# Patient Record
Sex: Male | Born: 1970 | Race: Black or African American | Hispanic: No | Marital: Single | Smoking: Never smoker
Health system: Southern US, Community
[De-identification: ages and names within clinical notes are randomized; demographics above are authoritative.]

## PROBLEM LIST (undated history)

## (undated) DIAGNOSIS — M1990 Unspecified osteoarthritis, unspecified site: Secondary | ICD-10-CM

## (undated) DIAGNOSIS — F419 Anxiety disorder, unspecified: Secondary | ICD-10-CM

## (undated) DIAGNOSIS — I10 Essential (primary) hypertension: Secondary | ICD-10-CM

## (undated) DIAGNOSIS — Z9889 Other specified postprocedural states: Secondary | ICD-10-CM

## (undated) DIAGNOSIS — I341 Nonrheumatic mitral (valve) prolapse: Secondary | ICD-10-CM

## (undated) DIAGNOSIS — I4891 Unspecified atrial fibrillation: Secondary | ICD-10-CM

## (undated) DIAGNOSIS — G4730 Sleep apnea, unspecified: Secondary | ICD-10-CM

## (undated) HISTORY — PX: NO PAST SURGERIES: SHX2092

## (undated) HISTORY — DX: Sleep apnea, unspecified: G47.30

## (undated) HISTORY — DX: Unspecified atrial fibrillation: I48.91

## (undated) HISTORY — DX: Nonrheumatic mitral (valve) prolapse: I34.1

## (undated) HISTORY — PX: COLONOSCOPY: SHX174

## (undated) HISTORY — PX: UMBILICAL HERNIA REPAIR: SHX196

## (undated) HISTORY — DX: Essential (primary) hypertension: I10

---

## 1997-12-07 ENCOUNTER — Emergency Department (HOSPITAL_COMMUNITY): Admission: EM | Admit: 1997-12-07 | Discharge: 1997-12-07 | Payer: Self-pay | Admitting: Emergency Medicine

## 2001-09-30 ENCOUNTER — Inpatient Hospital Stay (HOSPITAL_COMMUNITY): Admission: EM | Admit: 2001-09-30 | Discharge: 2001-10-02 | Payer: Self-pay | Admitting: Emergency Medicine

## 2001-09-30 ENCOUNTER — Encounter: Payer: Self-pay | Admitting: Emergency Medicine

## 2001-10-11 ENCOUNTER — Encounter: Admission: RE | Admit: 2001-10-11 | Discharge: 2001-10-11 | Payer: Self-pay | Admitting: Internal Medicine

## 2002-04-24 ENCOUNTER — Emergency Department (HOSPITAL_COMMUNITY): Admission: EM | Admit: 2002-04-24 | Discharge: 2002-04-24 | Payer: Self-pay | Admitting: Emergency Medicine

## 2012-05-10 ENCOUNTER — Emergency Department (HOSPITAL_COMMUNITY): Payer: BC Managed Care – PPO

## 2012-05-10 ENCOUNTER — Encounter (HOSPITAL_COMMUNITY): Payer: Self-pay | Admitting: Emergency Medicine

## 2012-05-10 ENCOUNTER — Emergency Department (HOSPITAL_COMMUNITY)
Admission: EM | Admit: 2012-05-10 | Discharge: 2012-05-11 | Disposition: A | Discharge status: Home / Self Care | Payer: BC Managed Care – PPO | Attending: Emergency Medicine | Admitting: Emergency Medicine

## 2012-05-10 DIAGNOSIS — R42 Dizziness and giddiness: Secondary | ICD-10-CM | POA: Insufficient documentation

## 2012-05-10 DIAGNOSIS — I4891 Unspecified atrial fibrillation: Secondary | ICD-10-CM | POA: Insufficient documentation

## 2012-05-10 DIAGNOSIS — R0602 Shortness of breath: Secondary | ICD-10-CM | POA: Insufficient documentation

## 2012-05-10 DIAGNOSIS — Z8659 Personal history of other mental and behavioral disorders: Secondary | ICD-10-CM | POA: Insufficient documentation

## 2012-05-10 HISTORY — DX: Anxiety disorder, unspecified: F41.9

## 2012-05-10 LAB — CBC WITH DIFFERENTIAL/PLATELET
Basophils Absolute: 0 10*3/uL (ref 0.0–0.1)
Basophils Relative: 1 % (ref 0–1)
Eosinophils Absolute: 0.1 10*3/uL (ref 0.0–0.7)
Eosinophils Relative: 2 % (ref 0–5)
HCT: 44 % (ref 39.0–52.0)
Hemoglobin: 15.7 g/dL (ref 13.0–17.0)
Lymphocytes Relative: 44 % (ref 12–46)
Lymphs Abs: 2.1 10*3/uL (ref 0.7–4.0)
MCH: 30.1 pg (ref 26.0–34.0)
MCHC: 35.7 g/dL (ref 30.0–36.0)
MCV: 84.5 fL (ref 78.0–100.0)
Monocytes Absolute: 0.5 10*3/uL (ref 0.1–1.0)
Monocytes Relative: 10 % (ref 3–12)
Neutro Abs: 2.1 10*3/uL (ref 1.7–7.7)
Neutrophils Relative %: 43 % (ref 43–77)
Platelets: 215 10*3/uL (ref 150–400)
RBC: 5.21 MIL/uL (ref 4.22–5.81)
RDW: 12.8 % (ref 11.5–15.5)
WBC: 4.8 10*3/uL (ref 4.0–10.5)

## 2012-05-10 LAB — BASIC METABOLIC PANEL
BUN: 18 mg/dL (ref 6–23)
CO2: 26 mEq/L (ref 19–32)
Calcium: 9.8 mg/dL (ref 8.4–10.5)
Chloride: 103 mEq/L (ref 96–112)
Creatinine, Ser: 0.98 mg/dL (ref 0.50–1.35)
GFR calc Af Amer: >90 mL/min (ref >90)
GFR calc non Af Amer: >90 mL/min (ref >90)
Glucose, Bld: 80 mg/dL (ref 70–99)
Potassium: 3.9 mEq/L (ref 3.5–5.1)
Sodium: 140 mEq/L (ref 135–145)

## 2012-05-10 LAB — TROPONIN I: Troponin I: <0.3 ng/mL (ref <0.30)

## 2012-05-10 MED ORDER — DILTIAZEM HCL 100 MG IV SOLR
10.0000 mg/h | Freq: Once | INTRAVENOUS | Status: AC
  Administered 2012-05-10: 10 mg/h via INTRAVENOUS
  Filled 2012-05-10: qty 100

## 2012-05-10 MED ORDER — DILTIAZEM HCL 50 MG/10ML IV SOLN
20.0000 mg | Freq: Once | INTRAVENOUS | Status: AC
  Administered 2012-05-10: 20 mg via INTRAVENOUS

## 2012-05-10 NOTE — ED Provider Notes (Addendum)
History     CSN: [id number]  Arrival date & time 05/10/12  2221   First MD Initiated Contact with Patient 05/10/12 2242      Chief Complaint  Patient presents with  . Palpitations    (Consider location/radiation/quality/duration/timing/severity/associated sxs/prior treatment) Patient is a 41 y.o. male presenting with palpitations. The history is provided by the patient.  Palpitations  Associated symptoms include shortness of breath. Pertinent negatives include no fever, no chest pain, no nausea, no vomiting, no headaches and no cough.   41 year old, male, with no significant past medical history presents to emergency department complaining of lightheadedness, palpitations in his heart, and mild shortness of breath.  He has been having similar mild symptoms for approximately a week.  When he lied down to go to bed.  Today.  His symptoms became more severe, so, he came to the emergency department.  He denies chest pain.  He denies fevers, chills, cough.  He denies leg pain or swelling.  He has not had any recent travel.  He does not smoke.  He denies drug use.  He does not use caffeinated beverages or any stimulants.  He denies bleeding from his nose, bottom, or any other sites.  Level V caveat applies for urgent need for intervention  Past Medical History  Diagnosis Date  . Anxiety     History reviewed. No pertinent past surgical history.  No family history on file.  History  Substance Use Topics  . Smoking status: Not on file  . Smokeless tobacco: Not on file  . Alcohol Use:       Review of Systems  Constitutional: Negative for fever and chills.  Respiratory: Positive for shortness of breath. Negative for cough and chest tightness.   Cardiovascular: Positive for palpitations. Negative for chest pain and leg swelling.  Gastrointestinal: Negative for nausea and vomiting.  Neurological: Positive for light-headedness. Negative for headaches.  All other systems reviewed and  are negative.    Allergies  Review of patient's allergies indicates no known allergies.  Home Medications  No current outpatient prescriptions on file.  BP 161/116  Pulse 130  Temp 98.3 F (36.8 C) (Oral)  Resp 20  SpO2 98%  Physical Exam  Nursing note and vitals reviewed. Constitutional: He is oriented to person, place, and time. He appears well-developed and well-nourished. No distress.  HENT:  Head: Normocephalic and atraumatic.  Eyes: Conjunctivae normal and EOM are normal.  Neck: Normal range of motion. Neck supple.  Cardiovascular: Intact distal pulses.   No murmur heard.      Tachycardia and irregular  Pulmonary/Chest: Effort normal and breath sounds normal. No respiratory distress. He has no rales.  Abdominal: Soft. Bowel sounds are normal. There is no tenderness.  Musculoskeletal: Normal range of motion. He exhibits no edema and no tenderness.  Neurological: He is alert and oriented to person, place, and time. No cranial nerve deficit.  Skin: Skin is warm and dry.  Psychiatric: He has a normal mood and affect. Thought content normal.    ED Course  Procedures (including critical care time) 41 year old, male, with no significant medical history presents with new-onset atrial fibrillation, with rapid ventricular response.  No identifiable inciting etiology.  We will perform laboratory testing, and chest x-ray, and treat with diltiazem and then consult cardiology, for further recommendations   Labs Reviewed  BASIC METABOLIC PANEL  CBC WITH DIFFERENTIAL  TROPONIN I   No results found.   No diagnosis found.  11:22 PM Rate controlled after  dilt bolus 20 mg.   Dilt. 10 mg//hr gtt started  11:56 PM Asx. Back in nsr.    ECG #1 Atrial fibrillation with rapid ventricular response at 128 beats per minute Normal axis. Normal intervals. 1 mm of ST segment elevation in leads V3 and V4.  ECG #2 Atrial fibrillation at 76 beats per minute.   Normal axis ST  segment elevation 1 mm in leads 1 and aVL with 2-3 mm elevation in lead V2 3 and V4 Normal intervals  ECG.  #3 Normal sinus rhythm at 84 beats per minute. Normal axis. Normal intervals. 1 mm ST segment elevation in lead 1 and aVL 2-3 mm ST segment elevation in leads V2, V3, V4  12:06 AM I spoke with the cardiologist, and asked him to come, evaluate the patient for new onset atrial fibrillation, with ST segment elevation on the EKGs.  We discussed both the history and physical of the patient including his lack of risk factors for coronary disease.  We discussed the EKGs and the negative cardiac enzymes.  The cardiologist asked whether or not.  This needs to be considered.  A STEMI and I told him I do not think so because the patient is asymptomatic.  I requested he come to the emergency department to evaluate the patient.  He stated he is in the Cath Lab, but he will be here within the next 10 minutes.  12:13 AM The cardiologist, was able to pull up.  The EKGs on the new system.  He said that the patient has LVH, with early repo and since the patient is asymptomatic.  He does not think any further workup is indicated.  He agreed that the patient could be discharged and followup in the office.  MDM  Atrial fibrillation, with rapid ventricular response, new onset        Cheri Guppy, MD 05/10/12 1610  Cheri Guppy, MD 05/10/12 9604  Cheri Guppy, MD 05/10/12 2357  Cheri Guppy, MD 05/11/12 5409  Cheri Guppy, MD 05/11/12 8119

## 2012-05-10 NOTE — ED Notes (Signed)
Advised by York Cerise, EMT that the patient has ST elevation on the monitor.  EKG taken and showed to EDP.

## 2012-05-10 NOTE — ED Notes (Signed)
PER EMS- Pt states for the last week he has been having trouble catching his breath. States tonight he feels like he having palpitation and discomfort/pressure in his chest. Sensation in chest is non radiating. Pt states in his 20's he had anxiety issues. Sinus Tach on monitor. NDKA. Alertx4, NAD. HR 80's, BP 163/110, 99% 2L [place].

## 2012-05-11 NOTE — ED Notes (Signed)
The patient is AOx4 and comfortable with his discharge instructions. 

## 2012-05-15 ENCOUNTER — Encounter: Payer: Self-pay | Admitting: Cardiovascular Disease

## 2012-05-15 ENCOUNTER — Ambulatory Visit (INDEPENDENT_AMBULATORY_CARE_PROVIDER_SITE_OTHER): Payer: BC Managed Care – PPO | Admitting: Cardiovascular Disease

## 2012-05-15 VITALS — BP 154/110 | HR 87 | Ht 68.0 in | Wt 185.1 lb

## 2012-05-15 DIAGNOSIS — I10 Essential (primary) hypertension: Secondary | ICD-10-CM | POA: Insufficient documentation

## 2012-05-15 DIAGNOSIS — I4891 Unspecified atrial fibrillation: Secondary | ICD-10-CM

## 2012-05-15 MED ORDER — HYDROCHLOROTHIAZIDE 25 MG PO TABS: 25.0000 mg | ORAL_TABLET | Freq: Every day | ORAL | Status: DC

## 2012-05-15 MED ORDER — ASPIRIN EC 81 MG PO TBEC: 81.0000 mg | DELAYED_RELEASE_TABLET | Freq: Every day | ORAL | Status: DC

## 2012-05-15 MED ORDER — CARVEDILOL 12.5 MG PO TABS: 12.5000 mg | ORAL_TABLET | Freq: Two times a day (BID) | ORAL | Status: DC

## 2012-05-15 MED ORDER — POTASSIUM CHLORIDE CRYS ER 10 MEQ PO TBCR: 10.0000 meq | EXTENDED_RELEASE_TABLET | Freq: Every day | ORAL | Status: DC

## 2012-05-15 NOTE — Patient Instructions (Addendum)
Your physician recommends that you schedule a follow-up appointment in: 1 month TO SEE LORI GERHARDT NP THEN 3 MONTHS TO SEE DR [facility]  Your physician has recommended you make the following change in your medication:   START CARVEDILOL 12.5 MG ONE TABLET TWICE DAILY 12 HOURS APART HCTZ 25 MG IN THE MORNING KDUR OR POTASSIUM 10 MEQ ONE TABLET DAILY WITH HCTZ ASPIRIN ENTERIC COATED 81 MG DAILY  Your physician recommends that you return for lab work in: TODAY TSH TO CHECK YOUR THYROID  Your physician has requested that you have an echocardiogram. Echocardiography is a painless test that uses sound waves to create images of your heart. It provides your doctor with information about the size and shape of your heart and how well your heart's chambers and valves are working. This procedure takes approximately one hour. There are no restrictions for this procedure.

## 2012-05-15 NOTE — Progress Notes (Signed)
    Jon Collins Date of Birth  01-Jun-1971       [facility]    [place] [address]  [address] [place], [place]  [address]   [place], [place]  [address] [phone]     [phone]   Fax  [phone]    Fax [phone]  Problem List: 1. Atrial Fibrillation 2. Hypertension  History of Present Illness:  Jon Collins is a 41 yo with a recentl ER visit for palpitations and dyspnea.  He was found to have A-Fib and also had ST elevation ( repol abnormality).  He had some lightheadedness with standing and called EMS.  He has been active in the past.  He was a runner in the past and is starting exercising.     He has numerous questions -     No current outpatient prescriptions on file prior to visit.    No Known Allergies  Past Medical History  Diagnosis Date  . Anxiety   . MVP (mitral valve prolapse)     ?patient states in his 74s he was diagnos with this    No past surgical history on file.  History  Smoking status  . Never Smoker   Smokeless tobacco  . Not on file    History  Alcohol Use     History reviewed. No pertinent family history.  Reviw of Systems:  Reviewed in the HPI.  All other systems are negative.  Physical Exam: Blood pressure 154/110, pulse 87, height 5\' 8"  (1.727 m), weight 185 lb 1.9 oz (83.97 kg). General: Well developed, well nourished, in no acute distress.  Head: Normocephalic, atraumatic, sclera non-icteric, mucus membranes are moist,   Neck: Supple. Carotids are 2 + without bruits. No JVD  Lungs: Clear bilaterally to auscultation.  Heart: regular rate.  normal  S1 S2. No murmurs, gallops or rubs.  Abdomen: Soft, non-tender, non-distended with normal bowel sounds. No hepatomegaly. No rebound/guarding. No masses.  Msk:  Strength and tone are normal  Extremities: No clubbing or cyanosis. No edema.  Distal pedal pulses are 2+ and equal bilaterally.  Neuro: Alert and oriented X 3.  Moves all extremities spontaneously.  Psych:  Responds to questions appropriately with a normal affect.  ECG: Nov. 5, 2013 - NSR, minimal voltage for LVH.  ST elevation anteriorly - unchanged from 05/10/12  Assessment / Plan:

## 2012-05-15 NOTE — Assessment & Plan Note (Signed)
Jon Collins presented to the ER with palpitations and was found to be in AF.  He was given IV diltiazem and converted to NSR.  We will continue to follow.  Will get an echo.  We discussed the possible etiologies.  It may be due to his HTN.  Will check TSH.  No significant alcohol intake., no drugs.

## 2012-05-15 NOTE — Assessment & Plan Note (Signed)
He has repol changes on his ECg.  I suspect that he has had HTN for a while.  Will add coreg 12.5 BID ( he is to start at 6.25 BID for the first week).  Will add HCTZ 25,Kdur 10 ).  Continue regular workouts.  He is not to lift heavy weights.

## 2012-05-16 ENCOUNTER — Ambulatory Visit (HOSPITAL_COMMUNITY): Payer: BC Managed Care – PPO | Attending: Cardiovascular Disease

## 2012-05-16 DIAGNOSIS — R0989 Other specified symptoms and signs involving the circulatory and respiratory systems: Secondary | ICD-10-CM | POA: Insufficient documentation

## 2012-05-16 DIAGNOSIS — I059 Rheumatic mitral valve disease, unspecified: Secondary | ICD-10-CM | POA: Insufficient documentation

## 2012-05-16 DIAGNOSIS — I10 Essential (primary) hypertension: Secondary | ICD-10-CM | POA: Insufficient documentation

## 2012-05-16 DIAGNOSIS — R0609 Other forms of dyspnea: Secondary | ICD-10-CM | POA: Insufficient documentation

## 2012-05-16 DIAGNOSIS — I4891 Unspecified atrial fibrillation: Secondary | ICD-10-CM | POA: Insufficient documentation

## 2012-05-16 DIAGNOSIS — R002 Palpitations: Secondary | ICD-10-CM | POA: Insufficient documentation

## 2012-05-16 NOTE — Progress Notes (Signed)
Echocardiogram performed.  

## 2012-06-13 ENCOUNTER — Encounter: Payer: Self-pay | Admitting: Nurse Practitioner

## 2012-06-13 ENCOUNTER — Ambulatory Visit (INDEPENDENT_AMBULATORY_CARE_PROVIDER_SITE_OTHER): Payer: BC Managed Care – PPO | Admitting: Nurse Practitioner

## 2012-06-13 VITALS — BP 110/88 | HR 76 | Ht 68.0 in | Wt 184.8 lb

## 2012-06-13 DIAGNOSIS — I10 Essential (primary) hypertension: Secondary | ICD-10-CM

## 2012-06-13 NOTE — Patient Instructions (Addendum)
You may use Coricidin HBP for any cold symptoms  Stay away from decongestants. No Afrin.   Stay on your current medicines  Dr. Elease Hashimoto will see you in about 2 months.  Call the [facility] office at [phone] if you have any questions, problems or concerns.

## 2012-06-13 NOTE — Progress Notes (Signed)
Jon Collins Date of Birth: 07/23/1970 Medical Record #[id number]  History of Present Illness: Jon Collins is seen back today for a one month check. He is seen for Dr. Elease Collins. He has had a lone episode of atrial fib. Has early repol on EKG. Started on BP medicines. Other issues include anxiety and MVP.   He comes in today. He is here alone. He has been started on Coreg, HCTZ and low dose KCL. An echo has shown grade 1 diastolic dysfunction but with a normal EF. He is doing good. Feeling better. No more palpitations. BP has trended down nicely. He checks it at work at [employer]. Still with some rare shortness of breath but overall improved. He does feel like he has some sinus issues and is asking about medicine for cold, cough, etc. He is back exercising.   Current Outpatient Prescriptions on File Prior to Visit  Medication Sig Dispense Refill  . aspirin EC 81 MG tablet Take 1 tablet (81 mg total) by mouth daily.  90 tablet  3  . carvedilol (COREG) 12.5 MG tablet Take 1 tablet (12.5 mg total) by mouth 2 (two) times daily with a meal.  60 tablet  5  . hydrochlorothiazide (HYDRODIURIL) 25 MG tablet Take 1 tablet (25 mg total) by mouth daily.  30 tablet  5  . potassium chloride (K-DUR,KLOR-CON) 10 MEQ tablet Take 1 tablet (10 mEq total) by mouth daily.  30 tablet  5    No Known Allergies  Past Medical History  Diagnosis Date  . Anxiety   . MVP (mitral valve prolapse)     ?patient states in his 43s he was diagnos with this  . PAF (paroxysmal atrial fibrillation)   . HTN (hypertension)     History reviewed. No pertinent past surgical history.  History  Smoking status  . Never Smoker   Smokeless tobacco  . Not on file    History  Alcohol Use No    History reviewed. No pertinent family history.  Review of Systems: The review of systems is per the HPI.  All other systems were reviewed and are negative.  Physical Exam: BP 110/88  Pulse 76  Ht 5\' 8"  (1.727 m)  Wt 184 lb  12.8 oz (83.825 kg)  BMI 28.10 kg/m2  SpO2 98% Patient is very pleasant and in no acute distress. Skin is warm and dry. Color is normal.  HEENT is unremarkable. Normocephalic/atraumatic. PERRL. Sclera are nonicteric. Neck is supple. No masses. No JVD. Lungs are clear. Cardiac exam shows a regular rate and rhythm. Abdomen is soft. Extremities are without edema. Gait and ROM are intact. No gross neurologic deficits noted.  LABORATORY DATA: BMET is pending  Echo Study Conclusions  - Left ventricle: The cavity size was normal. Wall thickness was normal. Systolic function was normal. The estimated ejection fraction was in the range of 55% to 65%. Wall motion was normal; there were no regional wall motion abnormalities. Features are consistent with a pseudonormal left ventricular filling pattern, with concomitant abnormal relaxation and increased filling pressure (grade 2 diastolic dysfunction). - Atrial septum: No defect or patent foramen ovale was identified.  Lab Results  Component Value Date   WBC 4.8 05/10/2012   HGB 15.7 05/10/2012   HCT 44.0 05/10/2012   PLT 215 05/10/2012   GLUCOSE 80 05/10/2012   NA 140 05/10/2012   K 3.9 05/10/2012   CL 103 05/10/2012   CREATININE 0.98 05/10/2012   BUN 18 05/10/2012   CO2 26  05/10/2012   TSH 0.71 05/15/2012     Assessment / Plan: 1. HTN - blood pressure is much better. Recheck a BMET today. He is to see Dr. Elease Collins in February.  2. PAF - remains in sinus.   We have talked about cold medicines. Needs to avoid decongestants. See Dr. Elease Collins in February.   Patient is agreeable to this plan and will call if any problems develop in the interim.

## 2012-06-14 LAB — BASIC METABOLIC PANEL
BUN: 15 mg/dL (ref 6–23)
CO2: 32 mEq/L (ref 19–32)
Calcium: 9.2 mg/dL (ref 8.4–10.5)
Chloride: 100 mEq/L (ref 96–112)
Creatinine, Ser: 1 mg/dL (ref 0.4–1.5)
GFR: 100.26 mL/min (ref >60.00)
Glucose, Bld: 87 mg/dL (ref 70–99)
Potassium: 3.7 mEq/L (ref 3.5–5.1)
Sodium: 137 mEq/L (ref 135–145)

## 2012-07-23 ENCOUNTER — Telehealth: Payer: Self-pay | Admitting: Cardiovascular Disease

## 2012-07-23 NOTE — Telephone Encounter (Signed)
PT WOULD LIKE TO DISCUSS HIS MEDS BECAUSE SHE DOSE NOT FEEL ANY BETTER

## 2012-07-23 NOTE — Telephone Encounter (Signed)
Pt c/o continued palpitations with sob off and on, request app sooner than app in February with Dr Elease Hashimoto, app given.

## 2012-07-27 ENCOUNTER — Encounter: Payer: Self-pay | Admitting: Nurse Practitioner

## 2012-07-27 ENCOUNTER — Ambulatory Visit (INDEPENDENT_AMBULATORY_CARE_PROVIDER_SITE_OTHER): Payer: BC Managed Care – PPO

## 2012-07-27 ENCOUNTER — Ambulatory Visit (INDEPENDENT_AMBULATORY_CARE_PROVIDER_SITE_OTHER): Payer: BC Managed Care – PPO | Admitting: Nurse Practitioner

## 2012-07-27 VITALS — BP 147/84 | HR 78 | Ht 68.0 in | Wt 183.8 lb

## 2012-07-27 DIAGNOSIS — R002 Palpitations: Secondary | ICD-10-CM

## 2012-07-27 LAB — BASIC METABOLIC PANEL
BUN: 17 mg/dL (ref 6–23)
CO2: 31 mEq/L (ref 19–32)
Calcium: 9.4 mg/dL (ref 8.4–10.5)
Chloride: 103 mEq/L (ref 96–112)
Creatinine, Ser: 1.1 mg/dL (ref 0.4–1.5)
GFR: 99.11 mL/min (ref >60.00)
Glucose, Bld: 90 mg/dL (ref 70–99)
Potassium: 3.7 mEq/L (ref 3.5–5.1)
Sodium: 139 mEq/L (ref 135–145)

## 2012-07-27 NOTE — Progress Notes (Signed)
Jon Collins Date of Birth: 1971/02/26 Medical Record #[id number]  History of Present Illness: Jon Collins is seen back today for a work in visit. He is seen for Dr. Elease Hashimoto. He has had a lone episode of atrial fib. Has early repol on EKG. Now on BP medicines. Other issues include MVP and anxiety.   I saw him about 6 weeks ago. He was doing ok.   He comes back today. He is complaining that he "never got any better". He is still having some palpitations and gets short of breath. Very fleeting in nature. Makes him anxious. Not using much caffeine but does endorse alcohol use. He is exercising. Not dizzy or lightheaded and no syncope. He is quite concerned and requests further testing.   Current Outpatient Prescriptions on File Prior to Visit  Medication Sig Dispense Refill  . aspirin EC 81 MG tablet Take 1 tablet (81 mg total) by mouth daily.  90 tablet  3  . carvedilol (COREG) 12.5 MG tablet Take 6.25 mg by mouth 2 (two) times daily with a meal.      . hydrochlorothiazide (HYDRODIURIL) 25 MG tablet Take 1 tablet (25 mg total) by mouth daily.  30 tablet  5  . potassium chloride (K-DUR,KLOR-CON) 10 MEQ tablet Take 1 tablet (10 mEq total) by mouth daily.  30 tablet  5    No Known Allergies  Past Medical History  Diagnosis Date  . Anxiety   . MVP (mitral valve prolapse)     ?patient states in his 25s he was diagnos with this  . PAF (paroxysmal atrial fibrillation)   . HTN (hypertension)     No past surgical history on file.  History  Smoking status  . Never Smoker   Smokeless tobacco  . Not on file    History  Alcohol Use No    No family history on file.  Review of Systems: The review of systems is per the HPI.  All other systems were reviewed and are negative.  Physical Exam: BP 147/84  Pulse 78  Ht 5\' 8"  (1.727 m)  Wt 183 lb 12.8 oz (83.371 kg)  BMI 27.95 kg/m2  SpO2 98% Patient is very pleasant and in no acute distress. He is anxious.  Skin is warm and dry.  Color is normal.  HEENT is unremarkable. Normocephalic/atraumatic. PERRL. Sclera are nonicteric. Neck is supple. No masses. No JVD. Lungs are clear. Cardiac exam shows a regular rate and rhythm. Abdomen is soft. Extremities are without edema. Gait and ROM are intact. No gross neurologic deficits noted.  LABORATORY DATA:  Echo Study Conclusions  - Left ventricle: The cavity size was normal. Wall thickness was normal. Systolic function was normal. The estimated ejection fraction was in the range of 55% to 65%. Wall motion was normal; there were no regional wall motion abnormalities. Features are consistent with a pseudonormal left ventricular filling pattern, with concomitant abnormal relaxation and increased filling pressure (grade 2 diastolic dysfunction). - Atrial septum: No defect or patent foramen ovale was identified.  Lab Results  Component Value Date   WBC 4.8 05/10/2012   HGB 15.7 05/10/2012   HCT 44.0 05/10/2012   PLT 215 05/10/2012   GLUCOSE 87 06/13/2012   NA 137 06/13/2012   K 3.7 06/13/2012   CL 100 06/13/2012   CREATININE 1.0 06/13/2012   BUN 15 06/13/2012   CO2 32 06/13/2012   TSH 0.71 05/15/2012     Assessment / Plan: 1. Palpitations - will place an event  monitor.  2. HTN - he reports better blood pressure control at home  3. History of hypokalemia - recheck lab today.  Will have him keep his OV with Dr. Elease Hashimoto for next month. I have advised him to cut back or stop his alcohol intake.  Patient is agreeable to this plan and will call if any problems develop in the interim.

## 2012-07-27 NOTE — Patient Instructions (Addendum)
We are going to place a heart monitor for the next 30 days  We are checking labs today  See Dr. Elease Hashimoto as planned next month.  Call the [facility] office at [phone] if you have any questions, problems or concerns.

## 2012-07-27 NOTE — Progress Notes (Signed)
Placed a 30 day event monitor on patient and went over instructions on how to use it and when to return it 

## 2012-08-15 ENCOUNTER — Encounter: Payer: Self-pay | Admitting: Cardiovascular Disease

## 2012-08-15 ENCOUNTER — Ambulatory Visit (INDEPENDENT_AMBULATORY_CARE_PROVIDER_SITE_OTHER): Payer: BC Managed Care – PPO | Admitting: Cardiovascular Disease

## 2012-08-15 VITALS — BP 116/88 | HR 77 | Ht 68.0 in | Wt 187.0 lb

## 2012-08-15 DIAGNOSIS — I4891 Unspecified atrial fibrillation: Secondary | ICD-10-CM

## 2012-08-15 DIAGNOSIS — I10 Essential (primary) hypertension: Secondary | ICD-10-CM

## 2012-08-15 MED ORDER — POTASSIUM CHLORIDE CRYS ER 20 MEQ PO TBCR: 20.0000 meq | EXTENDED_RELEASE_TABLET | Freq: Every day | ORAL | Status: DC

## 2012-08-15 MED ORDER — HYDROCHLOROTHIAZIDE 25 MG PO TABS: 25.0000 mg | ORAL_TABLET | Freq: Every day | ORAL | Status: DC

## 2012-08-15 MED ORDER — CARVEDILOL 6.25 MG PO TABS: 6.2500 mg | ORAL_TABLET | Freq: Two times a day (BID) | ORAL | Status: DC

## 2012-08-15 NOTE — Patient Instructions (Addendum)
Your physician has recommended you make the following change in your medication:  INCREASE KDUR TO 20 MEQ DAILY FOR LOW POTASSIUM  Your physician recommends that you return for lab work in: 1 MONTH/BMET  Your physician wants you to follow-up in: 6 MONTHS You will receive a reminder letter in the mail two months in advance. If you don't receive a letter, please call our office to schedule the follow-up appointment.

## 2012-08-15 NOTE — Assessment & Plan Note (Signed)
Jon Collins has done well. He's not had any further episodes of palpitations. He's wearing the event monitor but we've not recorded in. His atrial fibrillation seems to be better controlled on current medications.

## 2012-08-15 NOTE — Assessment & Plan Note (Signed)
He has done very well on current medications. We will refill his medications. We have increase his potassium to 20 mEq a day as his potassium slightly low. We'll recheck that value in one month. I'll see him again in 6 months for an office visit, EKG, and basic metabolic profile.

## 2012-08-15 NOTE — Progress Notes (Signed)
    Jon Collins Date of Birth  [birth year]       [facility]    [place] [address]  [address] [place], [place]  [address]   [place], [place]  [address] [phone]     [phone]   Fax  [phone]    Fax [phone]  Problem List: 1. Atrial Fibrillation 2. Hypertension  History of Present Illness:  Jon Collins is a 42 yo with a recentl ER visit for palpitations and dyspnea.  He was found to have A-Fib and also had ST elevation ( repol abnormality).  He had some lightheadedness with standing and called EMS.  He has been active in the past.  He was a runner in the past and is starting exercising.     Feb. 5, 2014: He has seen Lawson Fiscal this past month. He's wearing an event monitor. He's not had any significant arrhythmias. He has been feeling well. He's not had any recurrent arrhythmias. He has been exercising, lifting weights and doing all of his normal activities without any problems.    Current Outpatient Prescriptions on File Prior to Visit  Medication Sig Dispense Refill  . aspirin EC 81 MG tablet Take 1 tablet (81 mg total) by mouth daily.  90 tablet  3  . carvedilol (COREG) 12.5 MG tablet Take 6.25 mg by mouth 2 (two) times daily with a meal.      . hydrochlorothiazide (HYDRODIURIL) 25 MG tablet Take 1 tablet (25 mg total) by mouth daily.  30 tablet  5  . potassium chloride (K-DUR,KLOR-CON) 10 MEQ tablet Take 1 tablet (10 mEq total) by mouth daily.  30 tablet  5    No Known Allergies  Past Medical History  Diagnosis Date  . Anxiety   . MVP (mitral valve prolapse)     ?patient states in his 11s he was diagnos with this  . PAF (paroxysmal atrial fibrillation)   . HTN (hypertension)     No past surgical history on file.  History  Smoking status  . Never Smoker   Smokeless tobacco  . Not on file    History  Alcohol Use No    No family history on file.  Reviw of Systems:  Reviewed in the HPI.  All other  systems are negative.  Physical Exam: Blood pressure 116/88, pulse 77, height 5\' 8"  (1.727 m), weight 187 lb (84.823 kg), SpO2 95.00%. General: Well developed, well nourished, in no acute distress.  Head: Normocephalic, atraumatic, sclera non-icteric, mucus membranes are moist,   Neck: Supple. Carotids are 2 + without bruits. No JVD  Lungs: Clear bilaterally to auscultation.  Heart: regular rate.  normal  S1 S2. No murmurs, gallops or rubs.  Abdomen: Soft, non-tender, non-distended with normal bowel sounds. No hepatomegaly. No rebound/guarding. No masses.  Msk:  Strength and tone are normal  Extremities: No clubbing or cyanosis. No edema.  Distal pedal pulses are 2+ and equal bilaterally.  Neuro: Alert and oriented X 3. Moves all extremities spontaneously.  Psych:  Responds to questions appropriately with a normal affect.  ECG:  Assessment / Plan:

## 2012-09-12 ENCOUNTER — Telehealth: Payer: Self-pay | Admitting: Cardiovascular Disease

## 2012-09-12 ENCOUNTER — Other Ambulatory Visit: Payer: BC Managed Care – PPO

## 2012-09-12 NOTE — Telephone Encounter (Signed)
New Problem:    Patient called in wanting to know what aspirins (painkillers for a toothache)he would be able to take with his other medications.  Please call back.

## 2012-09-12 NOTE — Telephone Encounter (Signed)
REVIEWED WITH Norma Fredrickson NP, MAY USE ADVIL SPARINGLY AND TYLENOL, PT INFORMED.

## 2012-09-14 ENCOUNTER — Other Ambulatory Visit: Payer: BC Managed Care – PPO

## 2012-09-17 ENCOUNTER — Telehealth: Payer: Self-pay | Admitting: Cardiovascular Disease

## 2012-09-17 NOTE — Telephone Encounter (Signed)
New Problem:    Patient called in wanting to know if he would be able to take benedryil for swelling after his dental appointment.  Please call back.

## 2012-09-17 NOTE — Telephone Encounter (Signed)
Told pt he could have benadryl, call with further questions or concerns.

## 2012-09-21 ENCOUNTER — Other Ambulatory Visit: Payer: BC Managed Care – PPO

## 2012-09-26 ENCOUNTER — Other Ambulatory Visit (INDEPENDENT_AMBULATORY_CARE_PROVIDER_SITE_OTHER): Payer: BC Managed Care – PPO

## 2012-09-26 ENCOUNTER — Ambulatory Visit: Payer: BC Managed Care – PPO | Admitting: Nurse Practitioner

## 2012-09-26 DIAGNOSIS — I10 Essential (primary) hypertension: Secondary | ICD-10-CM

## 2012-09-26 DIAGNOSIS — I4891 Unspecified atrial fibrillation: Secondary | ICD-10-CM

## 2012-09-26 LAB — BASIC METABOLIC PANEL
Calcium: 9.2 mg/dL (ref 8.4–10.5)
Creatinine, Ser: 1.1 mg/dL (ref 0.4–1.5)
GFR: 90.06 mL/min (ref >60.00)
Glucose, Bld: 86 mg/dL (ref 70–99)
Sodium: 137 mEq/L (ref 135–145)

## 2012-10-11 ENCOUNTER — Other Ambulatory Visit: Payer: Self-pay | Admitting: *Deleted

## 2012-10-11 DIAGNOSIS — I4891 Unspecified atrial fibrillation: Secondary | ICD-10-CM

## 2012-10-11 DIAGNOSIS — I10 Essential (primary) hypertension: Secondary | ICD-10-CM

## 2012-10-11 MED ORDER — POTASSIUM CHLORIDE CRYS ER 20 MEQ PO TBCR: 20.0000 meq | EXTENDED_RELEASE_TABLET | Freq: Every day | ORAL | Status: DC

## 2012-10-11 MED ORDER — CARVEDILOL 6.25 MG PO TABS: 6.2500 mg | ORAL_TABLET | Freq: Two times a day (BID) | ORAL | Status: DC

## 2012-10-11 MED ORDER — HYDROCHLOROTHIAZIDE 25 MG PO TABS: 25.0000 mg | ORAL_TABLET | Freq: Every day | ORAL | Status: DC

## 2012-10-11 NOTE — Telephone Encounter (Signed)
Fax Received. Refill Completed. Hassani Sliney Chowoe (R.M.A)   

## 2013-02-18 ENCOUNTER — Encounter: Payer: Self-pay | Admitting: Cardiovascular Disease

## 2013-02-18 ENCOUNTER — Ambulatory Visit (INDEPENDENT_AMBULATORY_CARE_PROVIDER_SITE_OTHER): Payer: BC Managed Care – PPO | Admitting: Cardiovascular Disease

## 2013-02-18 VITALS — BP 104/82 | HR 75 | Ht 68.0 in | Wt 189.8 lb

## 2013-02-18 DIAGNOSIS — I10 Essential (primary) hypertension: Secondary | ICD-10-CM

## 2013-02-18 DIAGNOSIS — I4891 Unspecified atrial fibrillation: Secondary | ICD-10-CM

## 2013-02-18 NOTE — Progress Notes (Signed)
    Jon Collins Date of Birth  [birth year]       [facility]    [place] [address]  [address] [place], [place]  [address]   [place], [place]  [address] [phone]     [phone]   Fax  [phone]    Fax [phone]  Problem List: 1. Atrial Fibrillation 2. Hypertension  History of Present Illness:  Jon Collins is a 42 yo with a recentl ER visit for palpitations and dyspnea.  He was found to have A-Fib and also had ST elevation ( repol abnormality).  He had some lightheadedness with standing and called EMS.  He has been active in the past.  He was a runner in the past and is starting exercising.     Feb. 5, 2014: He has seen Jon Collins this past month. He's wearing an event monitor. He's not had any significant arrhythmias. He has been feeling well. He's not had any recurrent arrhythmias. He has been exercising, lifting weights and doing all of his normal activities without any problems.  February 18, 2013:  Jon Collins has been doing well.  No palpitation.    Still exercising regularly.    Current Outpatient Prescriptions on File Prior to Visit  Medication Sig Dispense Refill  . aspirin EC 81 MG tablet Take 1 tablet (81 mg total) by mouth daily.  90 tablet  3  . carvedilol (COREG) 6.25 MG tablet Take 1 tablet (6.25 mg total) by mouth 2 (two) times daily with a meal.  60 tablet  5  . hydrochlorothiazide (HYDRODIURIL) 25 MG tablet Take 1 tablet (25 mg total) by mouth daily.  30 tablet  5  . potassium chloride SA (K-DUR,KLOR-CON) 20 MEQ tablet Take 1 tablet (20 mEq total) by mouth daily.  30 tablet  5   No current facility-administered medications on file prior to visit.    No Known Allergies  Past Medical History  Diagnosis Date  . Anxiety   . MVP (mitral valve prolapse)     ?patient states in his 34s he was diagnos with this  . PAF (paroxysmal atrial fibrillation)   . HTN (hypertension)     No past surgical history on  file.  History  Smoking status  . Never Smoker   Smokeless tobacco  . Not on file    History  Alcohol Use No    No family history on file.  Reviw of Systems:  Reviewed in the HPI.  All other systems are negative.  Physical Exam: Blood pressure 104/82, pulse 75, height 5\' 8"  (1.727 m), weight 189 lb 12.8 oz (86.093 kg). General: Well developed, well nourished, in no acute distress.  Head: Normocephalic, atraumatic, sclera non-icteric, mucus membranes are moist,   Neck: Supple. Carotids are 2 + without bruits. No JVD  Lungs: Clear bilaterally to auscultation.  Heart: regular rate.  normal  S1 S2. No murmurs, gallops or rubs.  Abdomen: Soft, non-tender, non-distended with normal bowel sounds. No hepatomegaly. No rebound/guarding. No masses.  Msk:  Strength and tone are normal  Extremities: No clubbing or cyanosis. No edema.  Distal pedal pulses are 2+ and equal bilaterally.  Neuro: Alert and oriented X 3. Moves all extremities spontaneously.  Psych:  Responds to questions appropriately with a normal affect.  ECG: February 18, 2013:  NSR at 75.  Early repol.   Assessment / Plan:

## 2013-02-18 NOTE — Assessment & Plan Note (Signed)
He has not had any further episodes of AF.  Continue current meds.

## 2013-02-18 NOTE — Patient Instructions (Addendum)
Your physician wants you to follow-up in: 1 year  You will receive a reminder letter in the mail two months in advance. If you don't receive a letter, please call our office to schedule the follow-up appointment.   Your physician recommends that you return for lab work in: tomorrow/ bmet lipid / fasting drink water  Your physician recommends that you continue on your current medications as directed. Please refer to the Current Medication list given to you today.

## 2013-02-18 NOTE — Assessment & Plan Note (Signed)
His BP is well controlled.  Continue current meds.  We will check fasting labs ( BMP , lipid profile) this week.  I will see him again in a year.

## 2013-02-19 ENCOUNTER — Other Ambulatory Visit (INDEPENDENT_AMBULATORY_CARE_PROVIDER_SITE_OTHER): Payer: BC Managed Care – PPO

## 2013-02-19 DIAGNOSIS — I10 Essential (primary) hypertension: Secondary | ICD-10-CM

## 2013-02-19 DIAGNOSIS — I4891 Unspecified atrial fibrillation: Secondary | ICD-10-CM

## 2013-02-19 LAB — BASIC METABOLIC PANEL
BUN: 14 mg/dL (ref 6–23)
Calcium: 9.4 mg/dL (ref 8.4–10.5)
Chloride: 104 mEq/L (ref 96–112)
Creatinine, Ser: 1.1 mg/dL (ref 0.4–1.5)
GFR: 97.77 mL/min (ref >60.00)

## 2013-02-19 LAB — LIPID PANEL
Cholesterol: 229 mg/dL — ABNORMAL HIGH (ref 0–200)
Total CHOL/HDL Ratio: 6
Triglycerides: 108 mg/dL (ref 0.0–149.0)

## 2013-02-22 ENCOUNTER — Telehealth: Payer: Self-pay | Admitting: Cardiovascular Disease

## 2013-02-22 NOTE — Telephone Encounter (Signed)
Results reviewed

## 2013-02-22 NOTE — Telephone Encounter (Signed)
New Problem  Request lab results report

## 2013-06-11 ENCOUNTER — Other Ambulatory Visit: Payer: Self-pay | Admitting: *Deleted

## 2013-06-11 DIAGNOSIS — I4891 Unspecified atrial fibrillation: Secondary | ICD-10-CM

## 2013-06-11 DIAGNOSIS — I10 Essential (primary) hypertension: Secondary | ICD-10-CM

## 2013-06-11 MED ORDER — POTASSIUM CHLORIDE CRYS ER 20 MEQ PO TBCR: 20.0000 meq | EXTENDED_RELEASE_TABLET | Freq: Every day | ORAL | Status: DC

## 2013-06-11 MED ORDER — HYDROCHLOROTHIAZIDE 25 MG PO TABS: 25.0000 mg | ORAL_TABLET | Freq: Every day | ORAL | Status: DC

## 2013-06-14 ENCOUNTER — Other Ambulatory Visit: Payer: Self-pay

## 2013-06-14 MED ORDER — CARVEDILOL 6.25 MG PO TABS: 6.2500 mg | ORAL_TABLET | Freq: Two times a day (BID) | ORAL | Status: DC

## 2013-09-16 IMAGING — CR DG CHEST 1V PORT
1 series · 1 of 1 positions shown · non-contrast
Comparison: None.

CLINICAL DATA: Shortness of breath and palpitations.

PORTABLE CHEST - 1 VIEW

[AP]
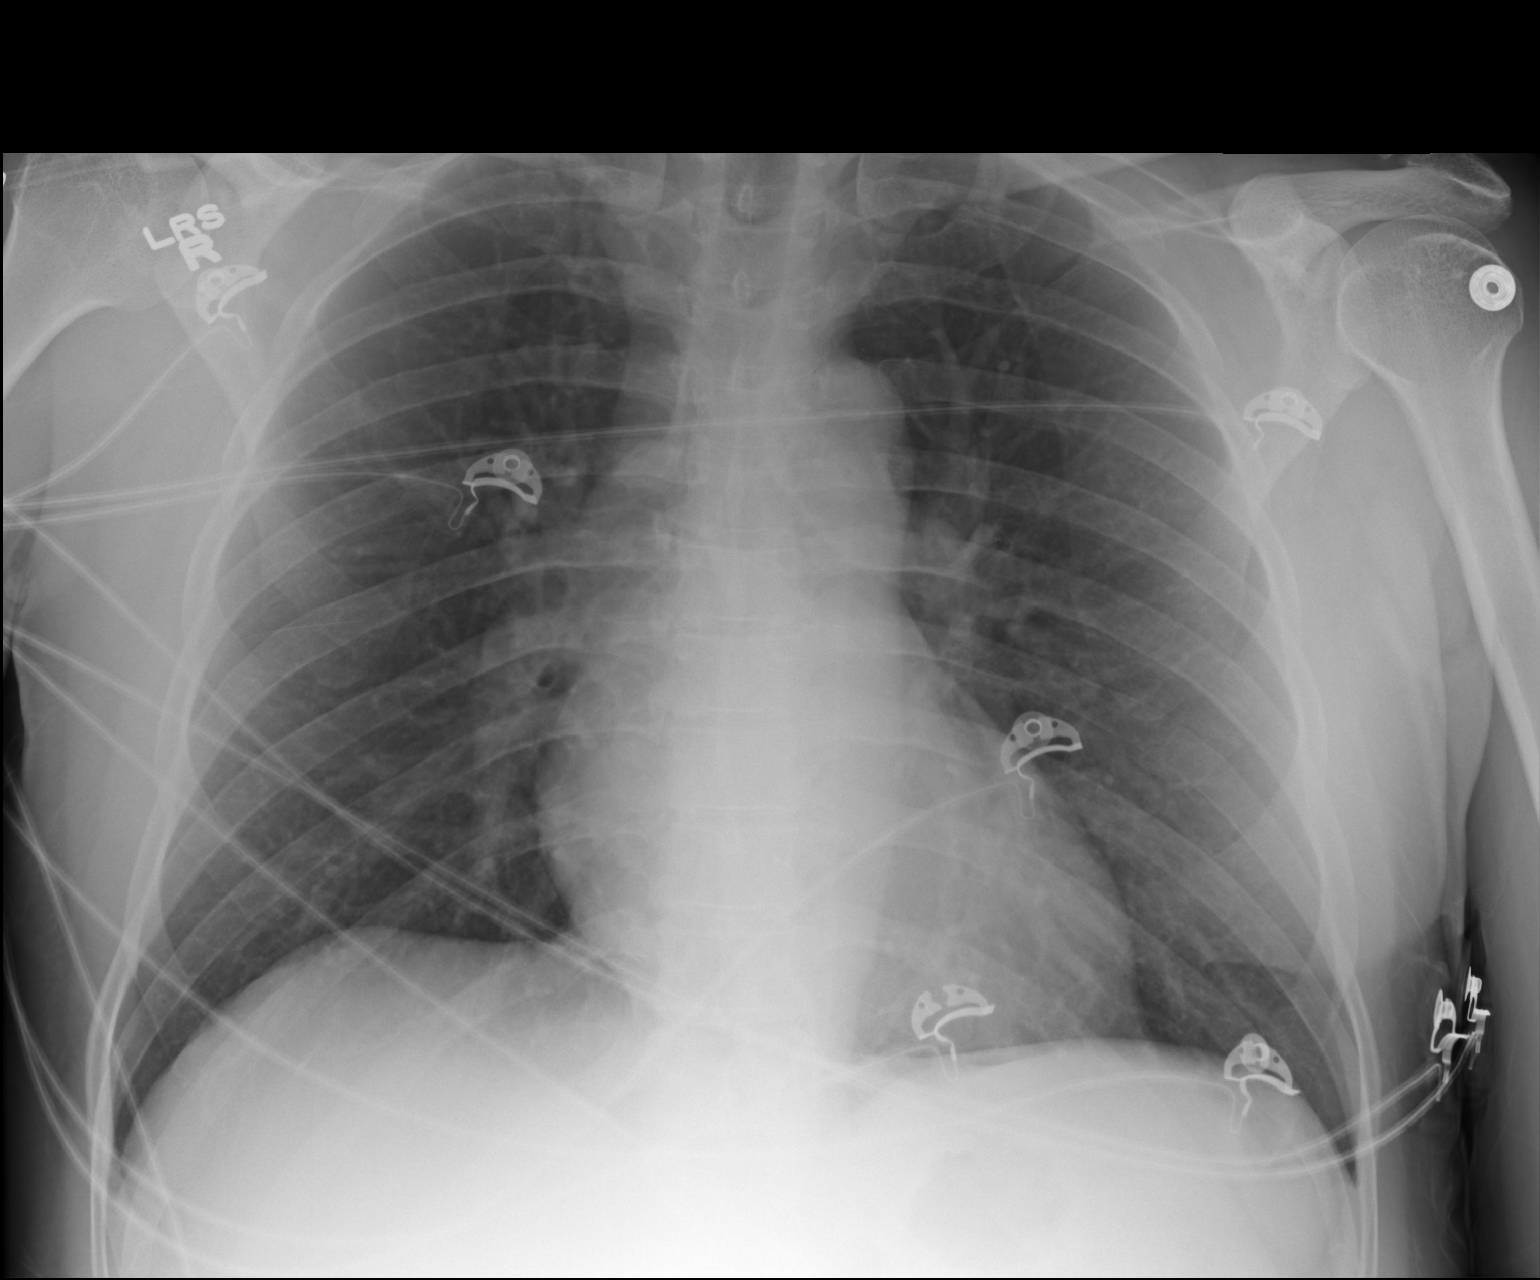

[1 of 1 positions shown; findings below may reference images not displayed]

FINDINGS: Lungs clear.  Heart size normal.  No pneumothorax or
pleural fluid.  No bony abnormality.
IMPRESSION: Negative chest.

## 2013-10-15 ENCOUNTER — Encounter: Payer: Self-pay | Admitting: Physician Assistant

## 2013-10-16 ENCOUNTER — Ambulatory Visit (INDEPENDENT_AMBULATORY_CARE_PROVIDER_SITE_OTHER): Payer: BC Managed Care – PPO | Admitting: Physician Assistant

## 2013-10-16 ENCOUNTER — Encounter: Payer: Self-pay | Admitting: Physician Assistant

## 2013-10-16 VITALS — BP 122/84 | HR 84 | Ht 68.0 in | Wt 198.0 lb

## 2013-10-16 DIAGNOSIS — G4730 Sleep apnea, unspecified: Secondary | ICD-10-CM

## 2013-10-16 DIAGNOSIS — R002 Palpitations: Secondary | ICD-10-CM

## 2013-10-16 DIAGNOSIS — I4891 Unspecified atrial fibrillation: Secondary | ICD-10-CM

## 2013-10-16 DIAGNOSIS — I10 Essential (primary) hypertension: Secondary | ICD-10-CM

## 2013-10-16 LAB — MAGNESIUM: MAGNESIUM: 2 mg/dL (ref 1.5–2.5)

## 2013-10-16 LAB — BASIC METABOLIC PANEL
BUN: 13 mg/dL (ref 6–23)
CALCIUM: 9.4 mg/dL (ref 8.4–10.5)
CO2: 25 mEq/L (ref 19–32)
Chloride: 102 mEq/L (ref 96–112)
Creatinine, Ser: 1.1 mg/dL (ref 0.4–1.5)
GFR: 95.39 mL/min (ref >60.00)
Glucose, Bld: 89 mg/dL (ref 70–99)
Potassium: 3.7 mEq/L (ref 3.5–5.1)
SODIUM: 137 meq/L (ref 135–145)

## 2013-10-16 NOTE — Progress Notes (Signed)
[address] [place], [place]  [address] Phone: [phone] Fax:  [phone]  Date:  10/16/2013   Patient ID:  Jon Collins, DOB Jun 26, 1971, MRN [id number]   PCP:  Default, Provider, MD  Cardiologist:  Dr. Elease HashimotoNahser  History of Present Illness: Jon Collins is a 43 y.o. male with history of HTN, atrial fibrillation, and untreated sleep apnea who presents to clinic for followup of palpitations. He was diagnosed with AF in 04/2012 in the ER and spontaneously converted. He has not had any other definite episodes since then. He wore a 1 month event monitor beginning 07/2012 with patient-activated events correlating to NSR. He reports being remotely diagnosed with sleep apnea, but admitted to never following up to arrange CPAP.   He has been doing well except he wanted to be checked out for intermittent palpitations for the past 3 days. These are like a "lighter" version of his atrial fib. He describes the palpitations as a flip, pause, then resumption of normal beat. They have happened intermittently the last 3 nights particularly when he is resting quietly. He cannot do anything to reproduce the symptoms. When he does feel a palpitation, it can be associated with brief sensation of dizziness or losing his breath. No sustained arrhythmias or tachypalpitations. He is very active at his job at [employer] in [place] and walks quite a bit without any limitation, particularly no exertional chest discomfort or dyspnea. He denies any changes in activity, caffeine, diet. No fevers, chills, infections, chest pain/pressure, bleeding, orthopnea, weight changes, or any other significant changes recently. He recently began taking an OTC version of Claritin for seasonal allergies. He did have a PVC noted on prior EKG in 04/2012.  Recent Labs: 02/19/2013: Creatinine 1.1; Direct LDL 176.4; HDL Cholesterol 41.20; Potassium 3.6   Wt Readings from Last 3 Encounters:  10/16/13 198 lb (89.812 kg)  02/18/13 189  lb 12.8 oz (86.093 kg)  08/15/12 187 lb (84.823 kg)     Past Medical History  Diagnosis Date  . Anxiety   . MVP (mitral valve prolapse)     a. Pt reported dx at age 43. 2D echo 05/2012: EF 55-60%, no RWMA, grade 2 diastolic dysfunction, no significant valvular abnormalities.  . Lone atrial fibrillation     a. Dx 04/2012 in ER. Event monitor 07/2012 for palpitations - pt triggers correlated with NSR only.  Marland Kitchen. HTN (hypertension)   . Sleep apnea     Current Outpatient Prescriptions  Medication Sig Dispense Refill  . aspirin EC 81 MG tablet Take 1 tablet (81 mg total) by mouth daily.  90 tablet  3  . carvedilol (COREG) 6.25 MG tablet Take 1 tablet (6.25 mg total) by mouth 2 (two) times daily with a meal.  60 tablet  5  . hydrochlorothiazide (HYDRODIURIL) 25 MG tablet Take 1 tablet (25 mg total) by mouth daily.  30 tablet  5  . potassium chloride SA (K-DUR,KLOR-CON) 20 MEQ tablet Take 1 tablet (20 mEq total) by mouth daily.  30 tablet  5   No current facility-administered medications for this visit.  + OTC Claritin per patient  Allergies:   Review of patient's allergies indicates no known allergies.   Social History:  The patient  reports that he has never smoked. He does not have any smokeless tobacco history on file. He reports that he does not drink alcohol or use illicit drugs.   Family History: No pertinent family history to this problem.  ROS:  Please  see the history of present illness.    All other systems reviewed and negative.   PHYSICAL EXAM:  VS:  BP 122/84  Pulse 84  Ht 5\' 8"  (1.727 m)  Wt 198 lb (89.812 kg)  BMI 30.11 kg/m2 Well nourished, well developed, in no acute distress HEENT: normal Neck: no JVD Cardiac:  normal S1, S2; RRR; no murmur Lungs:  clear to auscultation bilaterally, no wheezing, rhonchi or rales Abd: soft, nontender, no hepatomegaly Ext: no edema, 2+ pedal pulses and radial pulses equal bilaterally Skin: warm and dry Neuro:  moves all extremities  spontaneously, no focal abnormalities noted  EKG:  NSR 84bpm moderate voltage criteria for LVH, with associatde repolarization abnormality. Increased TWI in III - d/w MD, may be lead issue, otherwise not significantly changed from previous. (No sx to suggest angina.)  ASSESSMENT AND PLAN:  1. Palpitations - highly suggestive of intermittent PVCs or PACs. Will check BMET & Mg since he is on HCTZ to exclude any electrolyte abnormalities. H/o prior normal LV function. We discussed watchful waiting versus changing Coreg to another beta blocker such as metoprolol for more hopeful supression of heart rate/palpitations. He prefers to continue Coreg to see if palpitations subside on their own. Will hold off on repeat event monitor unless palpitations persist to assess burden. He will let us know how he is doing. 2. HTN - controlled. 3. Lone atrial fibrillation - no apparent recurrence since 2013. CHADSVASC = 1 for HTN. No med changes today as above. 4. Sleep apnea - we discussed importance of having this treated, especially in setting of palpitations, history of AF, and HTN. He plans to discuss with his PCP at his upcoming appointment.  Dispo: F/u 3 months with Dr. Elease Hashimoto.  Signed, Ronie Spies, PA-C  10/16/2013 1:42 PM

## 2013-10-16 NOTE — Patient Instructions (Addendum)
Your physician recommends that you have lab work today : BMET, Magnesium  Your physician recommends that you continue on your current medications as directed. Please refer to the Current Medication list given to you today.    Your physician recommends that you schedule a follow-up appointment in: 3 months with Dr. Elease HashimotoNahser

## 2013-10-17 ENCOUNTER — Telehealth: Payer: Self-pay | Admitting: Cardiovascular Disease

## 2013-10-17 NOTE — Telephone Encounter (Signed)
Pt was given normal lab results.

## 2013-10-17 NOTE — Telephone Encounter (Signed)
New message     Saw Jon Collins yesterday---want lab results

## 2013-12-18 ENCOUNTER — Other Ambulatory Visit: Payer: Self-pay | Admitting: Cardiovascular Disease

## 2013-12-18 ENCOUNTER — Other Ambulatory Visit: Payer: Self-pay

## 2013-12-18 DIAGNOSIS — I10 Essential (primary) hypertension: Secondary | ICD-10-CM

## 2013-12-18 DIAGNOSIS — I4891 Unspecified atrial fibrillation: Secondary | ICD-10-CM

## 2013-12-18 MED ORDER — HYDROCHLOROTHIAZIDE 25 MG PO TABS: 25.0000 mg | ORAL_TABLET | Freq: Every day | ORAL | Status: DC

## 2013-12-18 MED ORDER — POTASSIUM CHLORIDE CRYS ER 20 MEQ PO TBCR: 20.0000 meq | EXTENDED_RELEASE_TABLET | Freq: Every day | ORAL | Status: DC

## 2014-01-09 ENCOUNTER — Other Ambulatory Visit: Payer: Self-pay | Admitting: *Deleted

## 2014-01-09 ENCOUNTER — Other Ambulatory Visit: Payer: Self-pay | Admitting: Cardiovascular Disease

## 2014-01-23 ENCOUNTER — Encounter: Payer: Self-pay | Admitting: Cardiovascular Disease

## 2014-01-23 ENCOUNTER — Ambulatory Visit (INDEPENDENT_AMBULATORY_CARE_PROVIDER_SITE_OTHER): Payer: BC Managed Care – PPO | Admitting: Cardiovascular Disease

## 2014-01-23 VITALS — BP 114/86 | HR 100 | Ht 68.0 in | Wt 196.4 lb

## 2014-01-23 DIAGNOSIS — I10 Essential (primary) hypertension: Secondary | ICD-10-CM

## 2014-01-23 MED ORDER — CARVEDILOL 12.5 MG PO TABS: 12.5000 mg | ORAL_TABLET | Freq: Two times a day (BID) | ORAL | Status: DC

## 2014-01-23 NOTE — Patient Instructions (Addendum)
Your physician has recommended you make the following change in your medication:  INCREASE Carvedilol (Coreg) to 12.5 mg twice daily  Call Eligha BridegroomMichelle Ottilie Wigglesworth, RN at [phone] to report blood pressure and heart rate since increasing medication  Your physician recommends that you return for a follow-up visit with Tereso NewcomerScott Weaver, PA-C 8/24 at 3:40 pm  Your physician wants you to follow-up in: 1 year with Dr. Elease HashimotoNahser.  You will receive a reminder letter in the mail two months in advance. If you don't receive a letter, please call our office to schedule the follow-up appointment.

## 2014-01-23 NOTE — Assessment & Plan Note (Signed)
His blood pressure is well controlled with heart rate continues to be passed. I would  like to increase his carvedilol to 12.5 mg twice a day. We will have him see Lawson FiscalLori  in approximately 6 weeks for followup visit. We may need to decrease his HCTZ slightly.  He will call us if he has any dizziness.   I will see him in 1 year.

## 2014-01-23 NOTE — Progress Notes (Signed)
Jon Collins Date of Birth  25-Jun-1971       [facility]    [place] [address]  [address] [place], [place]  [address]   [place], [place]  [address] [phone]     [phone]   Fax  [phone]    Fax [phone]  Problem List: 1. Atrial Fibrillation 2. Hypertension  History of Present Illness:  Jon Collins is a 43 yo with a recentl ER visit for palpitations and dyspnea.  He was found to have A-Fib and also had ST elevation ( repol abnormality).  He had some lightheadedness with standing and called EMS.  He has been active in the past.  He was a runner in the past and is starting exercising.     Feb. 5, 2014: He has seen Jon Collins this past month. He's wearing an event monitor. He's not had any significant arrhythmias. He has been feeling well. He's not had any recurrent arrhythmias. He has been exercising, lifting weights and doing all of his normal activities without any problems.  February 18, 2013:  Jon Collins has been doing well.  No palpitation.    Still exercising regularly.    01/23/2014: Jon Collins is doing well. Has not had any significant arrhythmias. He has occasional palpitations.  He does a lot of walking at work but has not been out to the gym as much as he used to.  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    Current Outpatient Prescriptions on File Prior to Visit  Medication Sig Dispense Refill  . aspirin EC 81 MG tablet Take 1 tablet (81 mg total) by mouth daily.  90 tablet  3  . carvedilol (COREG) 6.25 MG tablet TAKE ONE TABLET BY MOUTH TWICE DAILY WITH MEALS  60 tablet  0  . hydrochlorothiazide (HYDRODIURIL) 25 MG tablet Take 1 tablet (25 mg total) by mouth daily.  90 tablet  3  . potassium chloride SA (K-DUR,KLOR-CON) 20 MEQ tablet Take 1 tablet (20 mEq total) by mouth daily.  90 tablet  3   No current facility-administered medications on file prior to visit.    No Known Allergies  Past Medical History   Diagnosis Date  . Anxiety   . MVP (mitral valve prolapse)     a. Pt reported dx at age 49. 2D echo 05/2012: EF 55-60%, no RWMA, grade 2 diastolic dysfunction, no significant valvular abnormalities.  . Lone atrial fibrillation     a. Dx 04/2012 in ER. Event monitor 07/2012 for palpitations - pt triggers correlated with NSR only.  Marland Kitchen HTN (hypertension)   . Sleep apnea     No past surgical history on file.  History  Smoking status  . Never Smoker   Smokeless tobacco  . Not on file    History  Alcohol Use No    No family history on file.  Reviw of Systems:  Reviewed in the HPI.  All other systems are negative.  Physical Exam: Blood pressure 114/86, pulse 100, height 5\' 8"  (1.727 m), weight 196 lb 6.4 oz (89.086 kg). General: Well developed, well nourished, in no acute distress.  Head: Normocephalic, atraumatic, sclera non-icteric, mucus membranes are moist,   Neck: Supple. Carotids are 2 + without bruits. No JVD  Lungs: Clear bilaterally to auscultation.  Heart: regular rate.  normal  S1 S2. No murmurs, gallops or rubs.  Abdomen: Soft, non-tender, non-distended with normal bowel sounds. No hepatomegaly. No rebound/guarding. No masses.  Msk:  Strength and tone are normal  Extremities: No clubbing or cyanosis. No edema.  Distal pedal pulses are 2+ and equal bilaterally.  Neuro: Alert and oriented X 3. Moves all extremities spontaneously.  Psych:  Responds to questions appropriately with a normal affect.  ECG: February 18, 2013:  NSR at 75.  Early repol.   Assessment / Plan:

## 2014-02-04 ENCOUNTER — Other Ambulatory Visit: Payer: Self-pay | Admitting: Cardiovascular Disease

## 2014-02-05 ENCOUNTER — Telehealth: Payer: Self-pay | Admitting: *Deleted

## 2014-02-05 NOTE — Telephone Encounter (Signed)
Pt has not tolerated the higher dose of coreg.  Have him reduce it back down to the original dose and  Follow.  Will discuss at his next visit.

## 2014-02-05 NOTE — Telephone Encounter (Signed)
Left message on patient's voice mail that he may return to original dose of Carvedilol and to keep f/u appointment.  I advised patient to call back with questions or concerns

## 2014-02-05 NOTE — Telephone Encounter (Signed)
Patient stated that after doubling his carvedilol dose he feels tired and sluggish. He is going to go back to his original dose. Please advise. Thanks, MI

## 2014-02-07 ENCOUNTER — Telehealth: Payer: Self-pay | Admitting: Cardiovascular Disease

## 2014-02-07 NOTE — Telephone Encounter (Signed)
New message     Have a question about his medication--carvedilol

## 2014-02-07 NOTE — Telephone Encounter (Signed)
Spoke with patient who called to verify that it is agreeable to take Carvedilol 6.25 mg twice daily as he has not tolerated the 12.5 mg BID.  I advised patient that Dr. Elease HashimotoNahser is agreeable to this plan and advised patient to make certain he is taking the medication 12 hours apart.  Patient states he has not been doing this and states this may help his fatigue.  Patient states he is checking his pulse regularly at work and it has slowed down to < 100 bpm, usually in the range of 80's bpm.  I verified patient's f/u appointment with Tereso NewcomerScott Weaver, PA-C on 8/24.  Patient thanked me for the call.

## 2014-03-03 ENCOUNTER — Encounter: Payer: Self-pay | Admitting: Physician Assistant

## 2014-03-03 ENCOUNTER — Ambulatory Visit (INDEPENDENT_AMBULATORY_CARE_PROVIDER_SITE_OTHER): Payer: BC Managed Care – PPO | Admitting: Physician Assistant

## 2014-03-03 VITALS — BP 120/90 | HR 80 | Ht 68.0 in | Wt 191.0 lb

## 2014-03-03 DIAGNOSIS — I4891 Unspecified atrial fibrillation: Secondary | ICD-10-CM

## 2014-03-03 DIAGNOSIS — R002 Palpitations: Secondary | ICD-10-CM

## 2014-03-03 DIAGNOSIS — I10 Essential (primary) hypertension: Secondary | ICD-10-CM

## 2014-03-03 DIAGNOSIS — I480 Paroxysmal atrial fibrillation: Secondary | ICD-10-CM

## 2014-03-03 NOTE — Progress Notes (Signed)
Cardiology Office Note    Date:  03/03/2014   ID:  Jon Collins, DOB 02-Aug-1970, MRN [id number]  PCP:  Default, Provider, MD  Cardiologist:  Dr. Delane Ginger      History of Present Illness: Jon Collins is a 43 y.o. male with a hx of HTN, paroxysmal AFib, OSA.  He was dx with AF in the ED in 04/2012 and spontaneously converted to NSR.  CHADS2-VASc=1.  He is not on anticoagulation.  Last seen by Dr. Delane Ginger 01/2014.  HR was high and his Coreg was increased.  He called back after the increase feeling "sluggish" and his Coreg was reduced back to 6.25 bid.  He feels better.  The patient denies chest pain, shortness of breath, syncope, orthopnea, PND or significant pedal edema.  He checks his BP often at work and it is "okay."      Studies:  - Echo (05/2012):  EF 55-65%, no RWMA, Gr 2 DD   Recent Labs/Images: 10/16/2013: Creatinine 1.1; Potassium 3.7   Wt Readings from Last 3 Encounters:  01/23/14 196 lb 6.4 oz (89.086 kg)  10/16/13 198 lb (89.812 kg)  02/18/13 189 lb 12.8 oz (86.093 kg)     Past Medical History  Diagnosis Date  . Anxiety   . MVP (mitral valve prolapse)     a. Pt reported dx at age 20. 2D echo 05/2012: EF 55-60%, no RWMA, grade 2 diastolic dysfunction, no significant valvular abnormalities.  . Lone atrial fibrillation     a. Dx 04/2012 in ER. Event monitor 07/2012 for palpitations - pt triggers correlated with NSR only.  Marland Kitchen HTN (hypertension)   . Sleep apnea     Current Outpatient Prescriptions  Medication Sig Dispense Refill  . aspirin EC 81 MG tablet Take 1 tablet (81 mg total) by mouth daily.  90 tablet  3  . carvedilol (COREG) 6.25 MG tablet TAKE ONE TABLET BY MOUTH TWICE DAILY WITH MEALS  60 tablet  0  . hydrochlorothiazide (HYDRODIURIL) 25 MG tablet Take 1 tablet (25 mg total) by mouth daily.  90 tablet  3  . potassium chloride SA (K-DUR,KLOR-CON) 20 MEQ tablet Take 1 tablet (20 mEq total) by mouth daily.  90 tablet  3   No current  facility-administered medications for this visit.     Allergies:   Review of patient's allergies indicates no known allergies.   Social History:  The patient  reports that he has never smoked. He does not have any smokeless tobacco history on file. He reports that he does not drink alcohol or use illicit drugs.   Family History:  The patient's family history is not on file.   ROS:  Please see the history of present illness.      All other systems reviewed and negative.   PHYSICAL EXAM: VS:  BP 120/90  Pulse 80  Ht  (1.727 m)  Wt 191 lb (86.637 kg)  BMI 29.05 kg/m2 Well nourished, well developed, in no acute distress HEENT: normal Neck: no JVD Cardiac:  normal S1, S2; RRR; no murmur Lungs:  clear to auscultation bilaterally, no wheezing, rhonchi or rales Abd: soft, nontender, no hepatomegaly Ext: no edema Skin: warm and dry Neuro:  CNs 2-12 intact, no focal abnormalities noted  EKG:  NSR, HR 80, LVH, early repol., no change from prior tracings.     ASSESSMENT AND PLAN:  Essential hypertension:  Borderline control.  He does admit to frequent soft drinks.  I have asked him to  eliminate this.  He checks his BP often at work and will call if his diastolic remains > 90.    Palpitations:  Overall stable.  Continue current rx.  Paroxysmal atrial fibrillation:  Single episode 2 years ago without recurrence.  TE risk factor profile is low and he is on ASA only.   Disposition:  FU with Dr. Delane Ginger in 01/2015 as planned.    Signed, Brynda Rim, MHS 03/03/2014 2:42 PM    [facility] [address] [place], [place], [place]  [address] Phone: [phone]; Fax: [phone]

## 2014-03-03 NOTE — Patient Instructions (Signed)
Your physician recommends that you continue on your current medications as directed. Please refer to the Current Medication list given to you today.  Your physician wants you to follow-up in: July 2016 You will receive a reminder letter in the mail two months in advance. If you don't receive a letter, please call our office to schedule the follow-up appointment.  

## 2014-04-25 ENCOUNTER — Other Ambulatory Visit: Payer: Self-pay

## 2014-04-25 MED ORDER — CARVEDILOL 6.25 MG PO TABS: ORAL_TABLET | ORAL | Status: DC

## 2014-06-12 ENCOUNTER — Telehealth: Payer: Self-pay | Admitting: Cardiovascular Disease

## 2014-06-12 NOTE — Telephone Encounter (Signed)
Spoke with patient who had question about his Carvedilol dosage.  Patient states he picked up a Rx for Carvedilol 6.25 mg BID and he thought he should be taking 3.125 mg BID. Patient states he thought the 6.25 mg dose was the dose he was taking when the medication was doubled a while ago.  I advised patient that the dosage when it was doubled was 12.5 mg and that 6.25 mg is his original dose.  Patient has an appointment with Boyce MediciBrittany Simmons, PA tomorrow and I advised him that she would adjust dosage if needed.  Patient verbalized understanding and agreement.

## 2014-06-12 NOTE — Telephone Encounter (Signed)
New message      Question about dosage of carvedilol.  He thought it should be the original milligram not 6.25.  Please call

## 2014-06-13 ENCOUNTER — Encounter: Payer: Self-pay | Admitting: Cardiology

## 2014-06-13 ENCOUNTER — Ambulatory Visit (INDEPENDENT_AMBULATORY_CARE_PROVIDER_SITE_OTHER): Payer: BC Managed Care – PPO | Admitting: Cardiology

## 2014-06-13 VITALS — BP 115/72 | HR 73 | Ht 68.0 in | Wt 194.4 lb

## 2014-06-13 DIAGNOSIS — R002 Palpitations: Secondary | ICD-10-CM

## 2014-06-13 DIAGNOSIS — I4891 Unspecified atrial fibrillation: Secondary | ICD-10-CM

## 2014-06-13 MED ORDER — METOPROLOL TARTRATE 25 MG PO TABS: 25.0000 mg | ORAL_TABLET | Freq: Two times a day (BID) | ORAL | Status: DC

## 2014-06-13 NOTE — Patient Instructions (Signed)
Your physician has recommended you make the following change in your medication:   STOP COREG  START METOPROLOL 25 MG TWICE A DAY     Your physician recommends that you return for lab work in:  TSH MAG AND BMET  Monday DEC 7    CALL OFFICE IF YOU CONTINUE TO HAVE ANY SYMPTOMS FOR A FOLLOW UP APPOINTMENT

## 2014-06-13 NOTE — Progress Notes (Signed)
06/13/2014 Kandice RobinsonsParrish Kofoed   03/17/1971  [id number]  Primary Physician Default, Provider, MD Primary Cardiologist: Dr. Elease HashimotoNahser  HPI:  The patient is a 43 year old male, followed by Dr. Elease HashimotoNahser, who presents to clinic today for evaluation regarding recurrent episodes of palpitations. He has a past documented  history of paroxysmal atrial fibrillation, on beta blocker therapy with Coreg. He is not on anticoagulation given a low CHADS2-VASc score of 1 (HTN). His other medical problems include obstructive sleep apnea, that is untreated, as well as hypertension.  For the last few weeks, he has noticed recurrent episodes of palpitations similar to his prior runs of atrial fibrillation. His episodes have increased in frequency to 4-5 days a week. Episodes typically last on average 10 minutes at a time and resolve spontaneously. He has had associated mild dyspnea but denies lightheadedness, syncope/near-syncope. He has had no chest discomfort. No weight gain or lower extremity edema. He reports full medication compliance with Coreg. He denies any excessive caffeine intake and no excessive alcohol intake. He has had no recent viral illnesses and no recent fevers or chills. Although diagnosed with obstructive sleep apnea in the past, he has failed to follow-up with his PCP regarding initiation of CPAP therapy.  His EKG in clinic today demonstrates normal sinus rhythm. Ventricular rate is 73 bpm. His blood pressures 115/72.   Current Outpatient Prescriptions  Medication Sig Dispense Refill  . aspirin EC 81 MG tablet Take 1 tablet (81 mg total) by mouth daily. 90 tablet 3  . carvedilol (COREG) 6.25 MG tablet TAKE ONE TABLET BY MOUTH TWICE DAILY WITH MEALS 60 tablet 6  . hydrochlorothiazide (HYDRODIURIL) 25 MG tablet Take 1 tablet (25 mg total) by mouth daily. 90 tablet 3  . potassium chloride SA (K-DUR,KLOR-CON) 20 MEQ tablet Take 1 tablet (20 mEq total) by mouth daily. 90 tablet 3   No current  facility-administered medications for this visit.    No Known Allergies  History   Social History  . Marital Status: Single    Spouse Name: N/A    Number of Children: N/A  . Years of Education: N/A   Occupational History  . Not on file.   Social History Main Topics  . Smoking status: Never Smoker   . Smokeless tobacco: Not on file  . Alcohol Use: No  . Drug Use: No  . Sexual Activity: Not on file   Other Topics Concern  . Not on file   Social History Narrative     Review of Systems: General: negative for chills, fever, night sweats or weight changes.  Cardiovascular: negative for chest pain, dyspnea on exertion, edema, orthopnea, paroxysmal nocturnal dyspnea or shortness of breath: Positive for palpitations Dermatological: negative for rash Respiratory: negative for cough or wheezing Urologic: negative for hematuria Abdominal: negative for nausea, vomiting, diarrhea, bright red blood per rectum, melena, or hematemesis Neurologic: negative for visual changes, syncope, or dizziness All other systems reviewed and are otherwise negative except as noted above.    Blood pressure 115/72, pulse 73, height 5\' 8"  (1.727 m), weight 194 lb 6.4 oz (88.179 kg).  General appearance: alert, cooperative and no distress Neck: no carotid bruit and no JVD Lungs: clear to auscultation bilaterally Heart: regular rate and rhythm, S1, S2 normal, no murmur, click, rub or gallop Extremities: no LEE Pulses: 2+ and symmetric Skin: warm and dry Neurologic: Grossly normal  EKG normal sinus rhythm. Ventricular rate 73 bpm  ASSESSMENT AND PLAN:   1. Palpitations: Patient has had intermittent  episodes that feel very similar to his prior runs of atrial fibrillation. EKG today demonstrates normal sinus rhythm with heart rate of 73 bpm. I recommended that we check basic labs including a TSH. He is also on hydrochlorothiazide for hypertension as well as supplemental potassium. I have recommended  that we check a BMP to assess potassium as well as check a magnesium level. I will also change his beta blocker from Coreg to Metroprolol in an effort to achieve better rate control, in hopes that this will limit the frequency of his episodes. Will place on 25 mg of Lopressor twice a day. Have also highly suggested that he follow-up with his PCP for treatment of this OSA for CPAP therapy. He was informed that untreated OSA is a risk factor for atrial fibrillation. We will see how he does with the change in beta blocker therapy. He has been instructed to notify our office if he continues to have palpitations. If so, we will plan on further evaluation with use of ambulatory cardiac monitoring to assess whether or not this is truly atrial fibrillation versus PVCs. If it is in fact atrial fibrillation, then we will refer to Dr. Elease HashimotoNahser  for consideration of initiation of antiarrhythmic therapy, perhaps flecainide. Given his young age, he may also be considered for A. fib ablation if he continues to have breakthrough atrial fibrillation despite medical therapy. Given his low CHADS2 -VASc score, anticoagulation is not indicated. He has been instructed to continue low-dose aspirin daily.   2. Hypertension: Well-controlled at  115/72. He is on hydrochlorothiazide. We will change his beta blocker from Coreg to Lopressor for suppression of his palpitations. Hopefully this will not result in significant increases in blood pressure.  3. Obstructive sleep apnea: Patient encouraged to follow-up with his PCP so that CPAP therapy can be arranged.   Bentley Fissel, BRITTAINYPA-C 06/13/2014 4:28 PM

## 2014-06-16 ENCOUNTER — Other Ambulatory Visit (INDEPENDENT_AMBULATORY_CARE_PROVIDER_SITE_OTHER): Payer: BC Managed Care – PPO | Admitting: *Deleted

## 2014-06-16 DIAGNOSIS — R002 Palpitations: Secondary | ICD-10-CM

## 2014-06-16 LAB — BASIC METABOLIC PANEL
BUN: 18 mg/dL (ref 6–23)
CO2: 28 mEq/L (ref 19–32)
Calcium: 9.1 mg/dL (ref 8.4–10.5)
Chloride: 102 mEq/L (ref 96–112)
Creatinine, Ser: 1 mg/dL (ref 0.4–1.5)
GFR: 100.45 mL/min (ref >60.00)
Glucose, Bld: 86 mg/dL (ref 70–99)
POTASSIUM: 3.5 meq/L (ref 3.5–5.1)
Sodium: 137 mEq/L (ref 135–145)

## 2014-06-16 LAB — MAGNESIUM: MAGNESIUM: 2 mg/dL (ref 1.5–2.5)

## 2014-06-16 LAB — TSH: TSH: 1.21 u[IU]/mL (ref 0.35–4.50)

## 2014-06-24 ENCOUNTER — Telehealth: Payer: Self-pay | Admitting: Cardiovascular Disease

## 2014-06-24 NOTE — Telephone Encounter (Signed)
New Message  Pt wanted to speak with a Rn concerning his lab results. Please call back and discuss.

## 2014-06-24 NOTE — Telephone Encounter (Addendum)
Called patient back and advised that TSH , Mg, and Bmet were all WNL from 12/7.

## 2014-09-30 DIAGNOSIS — K5733 Diverticulitis of large intestine without perforation or abscess with bleeding: Secondary | ICD-10-CM

## 2014-09-30 HISTORY — DX: Diverticulitis of large intestine without perforation or abscess with bleeding: K57.33

## 2014-12-26 ENCOUNTER — Other Ambulatory Visit: Payer: Self-pay | Admitting: Cardiovascular Disease

## 2015-01-28 ENCOUNTER — Encounter: Payer: Self-pay | Admitting: Cardiovascular Disease

## 2015-03-17 ENCOUNTER — Ambulatory Visit: Payer: BLUE CROSS/BLUE SHIELD | Admitting: Cardiovascular Disease

## 2015-04-07 ENCOUNTER — Ambulatory Visit: Payer: BLUE CROSS/BLUE SHIELD | Admitting: Cardiovascular Disease

## 2015-04-13 ENCOUNTER — Other Ambulatory Visit: Payer: Self-pay

## 2015-04-14 ENCOUNTER — Encounter: Payer: Self-pay | Admitting: Cardiovascular Disease

## 2015-04-14 ENCOUNTER — Ambulatory Visit (INDEPENDENT_AMBULATORY_CARE_PROVIDER_SITE_OTHER): Payer: BLUE CROSS/BLUE SHIELD | Admitting: Cardiovascular Disease

## 2015-04-14 VITALS — BP 118/84 | HR 67 | Ht 68.0 in | Wt 182.0 lb

## 2015-04-14 DIAGNOSIS — I10 Essential (primary) hypertension: Secondary | ICD-10-CM | POA: Diagnosis not present

## 2015-04-14 DIAGNOSIS — E785 Hyperlipidemia, unspecified: Secondary | ICD-10-CM

## 2015-04-14 HISTORY — DX: Hyperlipidemia, unspecified: E78.5

## 2015-04-14 LAB — LIPID PANEL
CHOL/HDL RATIO: 5.3 ratio — AB (ref <=5.0)
CHOLESTEROL: 196 mg/dL (ref 125–200)
HDL: 37 mg/dL — AB (ref >=40)
LDL Cholesterol: 146 mg/dL — ABNORMAL HIGH (ref <130)
TRIGLYCERIDES: 67 mg/dL (ref <150)
VLDL: 13 mg/dL (ref <30)

## 2015-04-14 NOTE — Progress Notes (Signed)
Jon Collins Date of Birth  May 26, 1971       [facility]    [place] [address]  [address] [place], [place]  [address]   [place], [place]  [address] [phone]     [phone]   Fax  [phone]    Fax [phone]  Problem List: 1. Paroxysmal Atrial Fibrillation 2. Hypertension 3. Diverticulitis   History of Present Illness:  Jon Collins is a 44 yo with a recentl ER visit for palpitations and dyspnea.  He was found to have A-Fib and also had ST elevation ( repol abnormality).  He had some lightheadedness with standing and called EMS.  He has been active in the past.  He was a runner in the past and is starting exercising.     Feb. 5, 2014: He has seen Lawson Fiscal this past month. He's wearing an event monitor. He's not had any significant arrhythmias. He has been feeling well. He's not had any recurrent arrhythmias. He has been exercising, lifting weights and doing all of his normal activities without any problems.  February 18, 2013:  Jon Collins has been doing well.  No palpitation.    Still exercising regularly.    01/23/2014: Jon Collins is doing well. Has not had any significant arrhythmias. He has occasional palpitations.  He does a lot of walking at work but has not been out to the gym as much as he used to.  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    Oct. 4, 2016:  Jon Collins is doing well Occasional palpitation , last only several minutes.  May have been related to stress.  No CP, dyspnea.   + family hx of elevated cholesterol ( fathers side )   Current Outpatient Prescriptions on File Prior to Visit  Medication Sig Dispense Refill  . aspirin EC 81 MG tablet Take 1 tablet (81 mg total) by mouth daily. 90 tablet 3  . carvedilol (COREG) 6.25 MG tablet Take 6.25 mg by mouth 2 (two) times daily with a meal.     . CIALIS 5 MG tablet Take 5 mg by mouth daily as needed for erectile dysfunction.     . hydrochlorothiazide  (HYDRODIURIL) 25 MG tablet TAKE ONE TABLET BY MOUTH ONCE DAILY 90 tablet 0  . KLOR-CON M20 20 MEQ tablet TAKE ONE TABLET BY MOUTH ONCE DAILY 90 tablet 0   No current facility-administered medications on file prior to visit.    No Known Allergies  Past Medical History  Diagnosis Date  . Anxiety   . MVP (mitral valve prolapse)     a. Pt reported dx at age 49. 2D echo 05/2012: EF 55-60%, no RWMA, grade 2 diastolic dysfunction, no significant valvular abnormalities.  . Lone atrial fibrillation (HCC)     a. Dx 04/2012 in ER. Event monitor 07/2012 for palpitations - pt triggers correlated with NSR only.  Marland Kitchen HTN (hypertension)   . Sleep apnea     No past surgical history on file.  History  Smoking status  . Never Smoker   Smokeless tobacco  . Not on file    History  Alcohol Use No    Family History  Problem Relation Age of Onset  . Heart attack Neg Hx   . Stroke Maternal Grandmother   . Hyperlipidemia Father     Reviw of Systems:  Reviewed in the HPI.  All other systems are negative.  Physical Exam: Blood pressure 118/84, pulse 67, height  (1.727  m), weight 82.555 kg (182 lb). General: Well developed, well nourished, in no acute distress.  Head: Normocephalic, atraumatic, sclera non-icteric, mucus membranes are moist,   Neck: Supple. Carotids are 2 + without bruits. No JVD  Lungs: Clear bilaterally to auscultation.  Heart: regular rate.  normal  S1 S2. No murmurs, gallops or rubs.  Abdomen: Soft, non-tender, non-distended with normal bowel sounds. No hepatomegaly. No rebound/guarding. No masses.  Msk:  Strength and tone are normal  Extremities: No clubbing or cyanosis. No edema.  Distal pedal pulses are 2+ and equal bilaterally.  Neuro: Alert and oriented X 3. Moves all extremities spontaneously.  Psych:  Responds to questions appropriately with a normal affect.  ECG: Oct. 4, 2016:  NSR at 67.  Early repol  Assessment / Plan:   1. Paroxysmal Atrial  Fibrillation -  Occasional palpitations .  No significant arrhythmias  2. Hypertension:  Well controlled.   Check BMP today   3. Hyperlipidemia :   Has elevated lipids as of 2 years ago.  Will check today .  Lab Results  Component Value Date   CHOL 196 04/14/2015   HDL 37* 04/14/2015   LDLCALC 146* 04/14/2015   LDLDIRECT 176.4 02/19/2013   TRIG 67 04/14/2015   CHOLHDL 5.3* 04/14/2015   Will likely start atorvastatin 20 mg  Check labs in 3 months   Nahser, Deloris Ping, MD  04/14/2015 8:28 AM    [facility] [address] [place],  [address] [place], [place]  [address] Pager [phone] Phone: [phone]; Fax: [phone]   [facility]  [address] [place], [place]  [address] [phone]   Fax [phone]

## 2015-04-14 NOTE — Patient Instructions (Signed)
Medication Instructions:  STOP Aspirin  Labwork: TODAY - cholesterol, liver, basic metabolic panel   Testing/Procedures: None Ordered  Follow-Up: Your physician wants you to follow-up in: 1 year with Dr. Nahser.  You will receive a reminder letter in the mail two months in advance. If you don't receive a letter, please call our office to schedule the follow-up appointment.     

## 2015-04-15 ENCOUNTER — Telehealth: Payer: Self-pay | Admitting: Cardiovascular Disease

## 2015-04-15 DIAGNOSIS — E785 Hyperlipidemia, unspecified: Secondary | ICD-10-CM

## 2015-04-15 LAB — HEPATIC FUNCTION PANEL
ALBUMIN: 4.2 g/dL (ref 3.6–5.1)
ALT: 23 U/L (ref 9–46)
AST: 23 U/L (ref 10–40)
Alkaline Phosphatase: 35 U/L — ABNORMAL LOW (ref 40–115)
Bilirubin, Direct: 0.1 mg/dL (ref <=0.2)
Indirect Bilirubin: 0.5 mg/dL (ref 0.2–1.2)
TOTAL PROTEIN: 7.8 g/dL (ref 6.1–8.1)
Total Bilirubin: 0.6 mg/dL (ref 0.2–1.2)

## 2015-04-15 LAB — BASIC METABOLIC PANEL
BUN: 16 mg/dL (ref 7–25)
CALCIUM: 9.6 mg/dL (ref 8.6–10.3)
CO2: 23 mmol/L (ref 20–31)
CREATININE: 0.95 mg/dL (ref 0.60–1.35)
Chloride: 103 mmol/L (ref 98–110)
GLUCOSE: 64 mg/dL — AB (ref 65–99)
Potassium: 3.8 mmol/L (ref 3.5–5.3)
SODIUM: 138 mmol/L (ref 135–146)

## 2015-04-15 NOTE — Telephone Encounter (Signed)
New Message  Pt called states that he received a VM to call back for results. Please call

## 2015-04-16 MED ORDER — ATORVASTATIN CALCIUM 20 MG PO TABS: 20.0000 mg | ORAL_TABLET | Freq: Every day | ORAL | Status: DC

## 2015-04-16 NOTE — Telephone Encounter (Signed)
Follow up   Pt called states that he received a VM to call back for results. Please call

## 2015-04-16 NOTE — Telephone Encounter (Signed)
Reviewed lab results and plan of care with patient who verbalized understanding and agreement to start Atorvastatin 20 mg once daily and to increase his intake of food early in the day. He states he rarely eats breakfast and doesn't usually eat anything until 12 or 1 pm.  I verified pharmacy for Rx and advised him that he will receive a reminder for 3 month lab check.  I advised him to call if he has questions or concerns prior to follow-up.  He thanked me for the call.

## 2015-06-03 ENCOUNTER — Telehealth: Payer: Self-pay | Admitting: Internal Medicine

## 2015-06-03 ENCOUNTER — Telehealth: Payer: Self-pay | Admitting: Cardiovascular Disease

## 2015-06-03 MED ORDER — PROPRANOLOL HCL 10 MG PO TABS: 10.0000 mg | ORAL_TABLET | Freq: Four times a day (QID) | ORAL | Status: DC | PRN

## 2015-06-03 NOTE — Telephone Encounter (Signed)
Spoke with pt who is reporting noticing increased palps over the last few days.  It feels like "flutters" and the only other symptom he has have was "alittle weak last night for a few minutes before going to sleep."  Reports being seen in the ED at [facility] yesterday - BP was 123/87 and HR was "under 90, the best I remember."  He denies any increase in caffeine and no use of stimulates.  He is still taking Carvedilol as directed and has not had any changes his medication.  No changes were made while the pt was evaluated at [facility].  He did have palps while there, and EKG was done but "nothing was found."  He is calling today because he wants "Dr Elease HashimotoNahser to get these palpitations under control."  Advised Dr Elease HashimotoNahser not in the office today but I will forward to him for his knowledge and recommendations/orders.

## 2015-06-03 NOTE — Telephone Encounter (Signed)
Cardiology On Call   Patient called to inquire about taking Propranolol as prescribed to him on top of Coreg he is already on.   He reports increasing episodes of palpitations. Has a diagnosis of paroxysmal AF. Not anticoagulated due to low CHA2DS2-VASc score. Never had DCCV. Never been on antiarrhythmic therapy. He does not have BP monitor at home.   Plan:  - He was advised not to start Propranolol  - He was advised to double his Carvedilol from 6.25 to 12.5 mg nightly and keep 6.25 mg qam. Monitor his pulse. Check his BP and not low then incerase Carvedilol to 12.5 in the am also.  - Call us back if any concerns or no improvement.   He verbalized understanding.    Nevin BloodgoodAmir Hevin Jeffcoat, MD

## 2015-06-03 NOTE — Telephone Encounter (Signed)
He has PAF. The event monitor ( in the past ) showed on NSR at times when he was having the sensation of palpitations. We can give him a script for Propranolol 10 mg to take QID as needed for palpitations ( along with the current coreg) see if that helps.  If it does, will consider increasing the Coreg to 12. 5 BID

## 2015-06-03 NOTE — Telephone Encounter (Signed)
Reviewed new orders with pt who states understanding.  An Rx for Propranolol 10 mg will be sent into pharmacy.

## 2015-06-03 NOTE — Telephone Encounter (Signed)
Patient c/o Palpitations:  High priority if patient c/o lightheadedness and shortness of breath.  1. How long have you been having palpitations? Off/on for 1 week  2. Are you currently experiencing lightheadedness and shortness of breath? Little dizziness/weakness  3. Have you checked your BP and heart rate? (document readings) pt stated- BP was normal   4. Are you experiencing any other symptoms? no

## 2015-06-05 ENCOUNTER — Telehealth: Payer: Self-pay | Admitting: Nurse Practitioner

## 2015-06-05 NOTE — Telephone Encounter (Signed)
Phone call this PM - having more palpitations. [facility] few days [facility]. Also spoke with Dr. Harvie BridgeNahser's nurse earlier this week - Propranolol called in. Spoke to the on call MD last night and was told to not take the Propranolol but to increase night dose of Coreg. Still with palpitations - feels a little weak and light headed. No documented AF per patient. Planning on seeing his PCP right after we hang up. Suggested having labs. Restrict caffeine and ok to increase Coreg to 12.5 MG BID.   He will call back next week if needs OV.  Rosalio MacadamiaLori C. Sheniqua Carolan, RN, ANP-C [facility] [address] [place], [place]  [id number] [phone]

## 2015-06-06 ENCOUNTER — Other Ambulatory Visit: Payer: Self-pay | Admitting: Nurse Practitioner

## 2015-06-06 ENCOUNTER — Other Ambulatory Visit: Payer: Self-pay | Admitting: Cardiovascular Disease

## 2015-06-06 MED ORDER — CARVEDILOL 12.5 MG PO TABS: 12.5000 mg | ORAL_TABLET | Freq: Two times a day (BID) | ORAL | Status: DC

## 2015-06-08 ENCOUNTER — Telehealth: Payer: Self-pay | Admitting: Cardiovascular Disease

## 2015-06-08 NOTE — Telephone Encounter (Signed)
°*  STAT* If patient is at the pharmacy, call can be transferred to refill team.   1. Which medications need to be refilled? (please list name of each medication and dose if known) Potassium, Hydroclorothiazide  2. Which pharmacy/location (including street and city if local pharmacy) is medication to be sent to? [facility] on [address] [place]  3. Do they need a 30 day or 90 day supply? 30 day

## 2015-06-09 ENCOUNTER — Telehealth: Payer: Self-pay | Admitting: Cardiovascular Disease

## 2015-06-09 MED ORDER — HYDROCHLOROTHIAZIDE 25 MG PO TABS: 25.0000 mg | ORAL_TABLET | Freq: Every day | ORAL | Status: DC

## 2015-06-09 MED ORDER — POTASSIUM CHLORIDE CRYS ER 20 MEQ PO TBCR: 20.0000 meq | EXTENDED_RELEASE_TABLET | Freq: Every day | ORAL | Status: DC

## 2015-06-09 NOTE — Telephone Encounter (Signed)
REFILL ALREADY SENT IN

## 2015-06-09 NOTE — Telephone Encounter (Signed)
*  STAT* If patient is at the pharmacy, call can be transferred to refill team. ° ° °1. Which medications need to be refilled? (please list name of each medication and dose if known) Potassium, Hydroclorothiazide ° °2. Which pharmacy/location (including street and city if local pharmacy) is medication to be sent to? [facility] on [address] in [place] ° °3. Do they need a 30 day or 90 day supply? 30 day ° ° °

## 2015-06-11 ENCOUNTER — Telehealth: Payer: Self-pay | Admitting: Cardiovascular Disease

## 2015-06-11 NOTE — Telephone Encounter (Signed)
I spoke with the pt and he continues to complain of palpitations and fluttering through out the day.  The pt has increased his Carvedilol to 12.5mg  twice a day as recommended by the on call staff.  The pt has not tried PRN Propranolol.  The pt was initially prescribed this medication when he was still taking carvedilol 6.25mg . I advised the pt that I would like Dr Elease HashimotoNahser to be aware that his Carvedilol has been increased to 12.5mg  bid before the pt starts PRN Propranolol.  We will follow-up with the pt after Dr Elease HashimotoNahser reviews this message.

## 2015-06-11 NOTE — Telephone Encounter (Signed)
Pt c/o medication issue:  1. Name of Medication: Carvedilol  3. Are you having a reaction (difficulty breathing--STAT)? Just flutters  4. What is your medication issue? Per patient he states that at one point he was getting palpitations but he found out that he was supposed to double up on the doses and ever since then the palpitations and flutters he gets on a daily basis. Was given a script for propanolol. Per pt has recieved the script and filled it but he hasn't taken the medication. Please call back to discuss med adjustments.

## 2015-06-12 NOTE — Telephone Encounter (Signed)
Spoke with patient and advised him that per Dr. Elease HashimotoNahser, he may take propranolol prn with Coreg.  I explained the difference between the 2 medications to him and answered his questions to his satisfaction.  I reviewed previous recommendation regarding diet - pt had low glucose at last ov and he admitted to not eating enough throughout the day.  He states that he had blood work done recently and was told that his K+ was low.  I advised that this was likely the cause for the increased palpitations.  I reviewed good dietary sources of K+ with him and advised him to call back with questions or concerns.  He verbalized understanding and agreement and thanked me for the call.

## 2015-06-12 NOTE — Telephone Encounter (Signed)
OK for him to take PRN propranolol with the higher dose of Coreg.

## 2015-06-15 ENCOUNTER — Telehealth: Payer: Self-pay | Admitting: Cardiovascular Disease

## 2015-06-15 NOTE — Telephone Encounter (Signed)
New message      Pt c/o medication issue:  1. Name of Medication: propranolol  2. How are you currently taking this medication (dosage and times per day)? 10mg --1 tab qid 3. Are you having a reaction (difficulty breathing--STAT)? no  4. What is your medication issue?  Pt has questions regarding this medication.  He would not tell me what the problem is

## 2015-06-15 NOTE — Telephone Encounter (Signed)
LMTCB

## 2015-06-16 NOTE — Telephone Encounter (Signed)
Spoke with patient who states his blood pressure has been 100-110's mmHg systolic and 70's mmHg diastolic recently; states he has been checking it at work. He denies dizziness or light-headedness, wondered if the propranolol was causing lower pressures. He states he takes 1-3 pills almost daily for palpitations.  I advised him that his BP has typically been in the range of 120's systolic, 70's diastolic at office visits and that his current BP is stable for him unless he is having symptoms.  He states he is scheduled for office visit with PCP today.  He was advised by PCP last week that his K+ blood level was low and he has been increasing his dietary intake of K+.  He states he thinks the palpitations are occurring less frequently.  I advised him to continue to monitor BP and to call back if he notices lower readings or if he becomes symptomatic.  I scheduled him for 3 month recheck of fasting lab work for 07/21/15.   He thanked me for the call.

## 2015-07-21 ENCOUNTER — Other Ambulatory Visit: Payer: BLUE CROSS/BLUE SHIELD

## 2015-07-27 ENCOUNTER — Other Ambulatory Visit (INDEPENDENT_AMBULATORY_CARE_PROVIDER_SITE_OTHER): Payer: BLUE CROSS/BLUE SHIELD | Admitting: *Deleted

## 2015-07-27 DIAGNOSIS — E785 Hyperlipidemia, unspecified: Secondary | ICD-10-CM | POA: Diagnosis not present

## 2015-07-27 LAB — BASIC METABOLIC PANEL
BUN: 18 mg/dL (ref 7–25)
CHLORIDE: 100 mmol/L (ref 98–110)
CO2: 27 mmol/L (ref 20–31)
CREATININE: 0.92 mg/dL (ref 0.60–1.35)
Calcium: 9.3 mg/dL (ref 8.6–10.3)
GLUCOSE: 86 mg/dL (ref 65–99)
POTASSIUM: 3.5 mmol/L (ref 3.5–5.3)
Sodium: 139 mmol/L (ref 135–146)

## 2015-07-27 LAB — LIPID PANEL
Cholesterol: 159 mg/dL (ref 125–200)
HDL: 47 mg/dL (ref >=40)
LDL Cholesterol: 94 mg/dL (ref <130)
TRIGLYCERIDES: 90 mg/dL (ref <150)
Total CHOL/HDL Ratio: 3.4 Ratio (ref <=5.0)
VLDL: 18 mg/dL (ref <30)

## 2015-07-27 LAB — HEPATIC FUNCTION PANEL
ALT: 32 U/L (ref 9–46)
AST: 23 U/L (ref 10–40)
Albumin: 3.9 g/dL (ref 3.6–5.1)
Alkaline Phosphatase: 37 U/L — ABNORMAL LOW (ref 40–115)
BILIRUBIN INDIRECT: 0.6 mg/dL (ref 0.2–1.2)
Bilirubin, Direct: 0.2 mg/dL (ref <=0.2)
TOTAL PROTEIN: 7.3 g/dL (ref 6.1–8.1)
Total Bilirubin: 0.8 mg/dL (ref 0.2–1.2)

## 2015-07-27 NOTE — Addendum Note (Signed)
Addended by: Tonita PhoenixBOWDEN, Jad Johansson K on: 07/27/2015 08:39 AM   Modules accepted: Orders

## 2015-07-27 NOTE — Addendum Note (Signed)
Addended by: Tonita PhoenixBOWDEN, Cardarius Senat K on: 07/27/2015 08:40 AM   Modules accepted: Orders

## 2015-07-27 NOTE — Addendum Note (Signed)
Addended by: Tonita PhoenixBOWDEN, Marry Kusch K on: 07/27/2015 08:56 AM   Modules accepted: Orders

## 2015-10-27 ENCOUNTER — Ambulatory Visit: Payer: BLUE CROSS/BLUE SHIELD | Admitting: Cardiovascular Disease

## 2015-11-03 ENCOUNTER — Ambulatory Visit: Payer: BLUE CROSS/BLUE SHIELD | Admitting: Cardiovascular Disease

## 2015-12-01 ENCOUNTER — Encounter: Payer: Self-pay | Admitting: Cardiovascular Disease

## 2015-12-01 ENCOUNTER — Ambulatory Visit (INDEPENDENT_AMBULATORY_CARE_PROVIDER_SITE_OTHER): Payer: BLUE CROSS/BLUE SHIELD | Admitting: Cardiovascular Disease

## 2015-12-01 VITALS — BP 126/88 | HR 84

## 2015-12-01 DIAGNOSIS — I10 Essential (primary) hypertension: Secondary | ICD-10-CM | POA: Diagnosis not present

## 2015-12-01 DIAGNOSIS — E785 Hyperlipidemia, unspecified: Secondary | ICD-10-CM | POA: Diagnosis not present

## 2015-12-01 DIAGNOSIS — Z79899 Other long term (current) drug therapy: Secondary | ICD-10-CM | POA: Diagnosis not present

## 2015-12-01 LAB — HEPATIC FUNCTION PANEL
ALT: 25 U/L (ref 9–46)
AST: 18 U/L (ref 10–40)
Albumin: 4 g/dL (ref 3.6–5.1)
Alkaline Phosphatase: 35 U/L — ABNORMAL LOW (ref 40–115)
BILIRUBIN DIRECT: 0.1 mg/dL (ref <=0.2)
Indirect Bilirubin: 0.6 mg/dL (ref 0.2–1.2)
Total Bilirubin: 0.7 mg/dL (ref 0.2–1.2)
Total Protein: 7.1 g/dL (ref 6.1–8.1)

## 2015-12-01 LAB — BASIC METABOLIC PANEL
BUN: 15 mg/dL (ref 7–25)
CO2: 24 mmol/L (ref 20–31)
Calcium: 9.1 mg/dL (ref 8.6–10.3)
Chloride: 103 mmol/L (ref 98–110)
Creat: 0.97 mg/dL (ref 0.60–1.35)
GLUCOSE: 93 mg/dL (ref 65–99)
POTASSIUM: 3.7 mmol/L (ref 3.5–5.3)
SODIUM: 138 mmol/L (ref 135–146)

## 2015-12-01 LAB — LIPID PANEL
CHOL/HDL RATIO: 4.3 ratio (ref <=5.0)
CHOLESTEROL: 197 mg/dL (ref 125–200)
HDL: 46 mg/dL (ref >=40)
LDL Cholesterol: 129 mg/dL (ref <130)
Triglycerides: 111 mg/dL (ref <150)
VLDL: 22 mg/dL (ref <30)

## 2015-12-01 NOTE — Patient Instructions (Addendum)
Medication Instructions:   Your physician recommends that you continue on your current medications as directed. Please refer to the Current Medication list given to you today.  Labwork:TODAY BMET  LIPID LIVER  Testing/Procedures: NONE  Follow-Up:Your physician wants you to follow-up in: YEAR WITH DR NAHSER You will receive a reminder letter in the mail two months in advance. If you don't receive a letter, please call our office to schedule the follow-up appointment.   Any Other Special Instructions Will Be Listed Below (If Applicable).     If you need a refill on your cardiac medications before your next appointment, please call your pharmacy.   

## 2015-12-01 NOTE — Progress Notes (Signed)
Jon Collins Date of Birth  [birth year]       [facility]    [place] [address]  [address], [address] [place], [place]  [address]   [place], [place]  [address] [phone]     [phone]   Fax  [phone]    Fax [phone]  Problem List: 1. Paroxysmal Atrial Fibrillation 2. Hypertension 3. Diverticulitis   History of Present Illness:  Jon Collins is a 45 yo with a recentl ER visit for palpitations and dyspnea.  He was found to have A-Fib and also had ST elevation ( repol abnormality).  He had some lightheadedness with standing and called EMS.  He has been active in the past.  He was a runner in the past and is starting exercising.     Feb. 5, 2014: He has seen Jon Collins this past month. He's wearing an event monitor. He's not had any significant arrhythmias. He has been feeling well. He's not had any recurrent arrhythmias. He has been exercising, lifting weights and doing all of his normal activities without any problems.  February 18, 2013:  Jon Collins has been doing well.  No palpitation.    Still exercising regularly.    01/23/2014: Jon Collins is doing well. Has not had any significant arrhythmias. He has occasional palpitations.  He does a lot of walking at work but has not been out to the gym as much as he used to.  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    Oct. 4, 2016:  Jon Collins is doing well Occasional palpitation , last only several minutes.  May have been related to stress.  No CP, dyspnea.   + family hx of elevated cholesterol ( fathers side )   Dec 01, 2015:  Jon Collins is a 45 y.o. with hx of HTN.   Has had PAF in the past but not recently  Has continued to have palpitations at night.  The palpitations lasted for several weeks.  They have resolved.  Would occur primarily at night.   Not much during the day .  He wore an event monitor 3 years ago .   Current Outpatient Prescriptions on File Prior to Visit  Medication Sig  Dispense Refill  . atorvastatin (LIPITOR) 20 MG tablet Take 1 tablet (20 mg total) by mouth daily. 31 tablet 11  . carvedilol (COREG) 12.5 MG tablet Take 1 tablet (12.5 mg total) by mouth 2 (two) times daily with a meal. 60 tablet 6  . hydrochlorothiazide (HYDRODIURIL) 25 MG tablet Take 1 tablet (25 mg total) by mouth daily. 90 tablet 3   No current facility-administered medications on file prior to visit.    No Known Allergies  Past Medical History  Diagnosis Date  . Anxiety   . MVP (mitral valve prolapse)     a. Pt reported dx at age 50. 2D echo 05/2012: EF 55-60%, no RWMA, grade 2 diastolic dysfunction, no significant valvular abnormalities.  . Lone atrial fibrillation (HCC)     a. Dx 04/2012 in ER. Event monitor 07/2012 for palpitations - pt triggers correlated with NSR only.  Marland Kitchen HTN (hypertension)   . Sleep apnea     Past Surgical History  Procedure Laterality Date  . No past surgeries      History  Smoking status  . Never Smoker   Smokeless tobacco  . Never Used    History  Alcohol Use No    Family History  Problem Relation Age of Onset  .  Heart attack Neg Hx   . Stroke Maternal Grandmother   . Hyperlipidemia Father     Reviw of Systems:  Reviewed in the HPI.  All other systems are negative.  Physical Exam: Blood pressure 126/88, pulse 84. General: Well developed, well nourished, in no acute distress.  Head: Normocephalic, atraumatic, sclera non-icteric, mucus membranes are moist,  Neck: Supple. Carotids are 2 + without bruits. No JVD Lungs: Clear bilaterally to auscultation. Heart: regular rate.  normal  S1 S2. No murmurs, gallops or rubs. Abdomen: Soft, non-tender, non-distended with normal bowel sounds. No hepatomegaly. No rebound/guarding. No masses. Msk:  Strength and tone are normal Extremities: No clubbing or cyanosis. No edema.  Distal pedal pulses are 2+ and equal bilaterally. Neuro: Alert and oriented X 3. Moves all extremities  spontaneously. Psych:  Responds to questions appropriately with a normal affect.  ECG:  Assessment / Plan:   1. Paroxysmal Atrial Fibrillation -  Occasional palpitations at this point .  No significant arrhythmias.  We discussed placing a 30 day event monitor on him - he wants to wait and see if they happen again .   2. Hypertension:  Well controlled.   Check BMP today   3. Hyperlipidemia :   Has elevated lipids as of 2 years ago.  Will check  today . CMET and lipids       Kristeen MissPhilip Tateanna Bach, MD  12/01/2015 8:38 AM    [facility] [address],  [address] [place], [place]  [address] Pager [phone] Phone: [phone]; Fax: [phone]

## 2015-12-02 ENCOUNTER — Telehealth: Payer: Self-pay | Admitting: Nurse Practitioner

## 2015-12-02 DIAGNOSIS — I10 Essential (primary) hypertension: Secondary | ICD-10-CM

## 2015-12-02 MED ORDER — POTASSIUM CHLORIDE CRYS ER 20 MEQ PO TBCR: 20.0000 meq | EXTENDED_RELEASE_TABLET | Freq: Two times a day (BID) | ORAL | Status: DC

## 2015-12-02 NOTE — Telephone Encounter (Signed)
Results and plan of care of lab results reviewed with patient who verbalized understanding and agreement.  He is scheduled for repeat bmet on 6/13

## 2015-12-02 NOTE — Telephone Encounter (Signed)
-----   Message from Vesta MixerPhilip J Nahser, MD sent at 12/02/2015 12:54 PM EDT ----- Labs are good. Potassium is at the low end of normal Increase kdur to 20 BID ,  Recheck BMP in 2-3 weeks

## 2015-12-22 ENCOUNTER — Other Ambulatory Visit: Payer: BLUE CROSS/BLUE SHIELD

## 2016-01-14 DIAGNOSIS — I341 Nonrheumatic mitral (valve) prolapse: Secondary | ICD-10-CM | POA: Insufficient documentation

## 2016-01-19 ENCOUNTER — Other Ambulatory Visit: Payer: Self-pay

## 2016-01-19 MED ORDER — CARVEDILOL 12.5 MG PO TABS: 12.5000 mg | ORAL_TABLET | Freq: Two times a day (BID) | ORAL | Status: DC

## 2016-01-19 NOTE — Telephone Encounter (Signed)
Vesta MixerPhilip J Nahser, MD at 12/01/2015 8:38 AM  carvedilol (COREG) 12.5 MG tabletTake 1 tablet (12.5 mg total) by mouth 2 (two) times daily with a meal Patient Instructions     Medication Instructions:  Your physician recommends that you continue on your current medications as directed. Please refer to the Current Medication list given to you today.

## 2016-02-05 ENCOUNTER — Telehealth: Payer: Self-pay | Admitting: *Deleted

## 2016-02-05 NOTE — Telephone Encounter (Signed)
called to verify pt is taking carvedilol BID & to fill this for the pt, spoke with Sheran Luz, verified they have the new RX from 01/19/16, ask them to fill for pt. she will call and inform pt they are filling.

## 2016-05-15 ENCOUNTER — Other Ambulatory Visit: Payer: Self-pay | Admitting: Cardiovascular Disease

## 2016-06-29 ENCOUNTER — Other Ambulatory Visit: Payer: Self-pay | Admitting: Cardiovascular Disease

## 2016-09-17 ENCOUNTER — Other Ambulatory Visit: Payer: Self-pay | Admitting: Cardiovascular Disease

## 2016-09-20 ENCOUNTER — Other Ambulatory Visit: Payer: Self-pay | Admitting: *Deleted

## 2016-09-20 MED ORDER — CARVEDILOL 12.5 MG PO TABS: 12.5000 mg | ORAL_TABLET | Freq: Two times a day (BID) | ORAL | 0 refills | Status: DC

## 2016-09-20 NOTE — Telephone Encounter (Signed)
CHECKING ON REFILL, INFORMED PT ITS AT THE PHARMACY. PT EXPRESSED UNDERSTANDING.

## 2016-12-29 ENCOUNTER — Other Ambulatory Visit: Payer: Self-pay | Admitting: Cardiovascular Disease

## 2017-01-07 ENCOUNTER — Telehealth: Payer: Self-pay | Admitting: Nurse Practitioner

## 2017-01-07 NOTE — Telephone Encounter (Signed)
   Pt called to report elevated blood pressures (150's to 160's) recently.  Has noted occasional palpitations.  Occasional "cramping" in chest - very brief.  Occasional, brief episode of dyspnea.  [facility].  PCP has increased coreg and bp somewhat better.  Advised to call us for f/u.  I made appt for him on 7/17 with Vin Bhagat.  Caller verbalized understanding and was grateful for the call back.  Nicolasa Duckinghristopher Thula Stewart, NP 01/07/2017, 9:48 AM

## 2017-01-22 NOTE — Progress Notes (Deleted)
Cardiology Office Note    Date:  01/22/2017   ID:  Jon Collins, DOB 1970-12-19, MRN [id number]  PCP:  [facility]i [facility]ier[facility], [facility]  Cardiologist:  Dr. Elease HashimotoNahser  Chief Complaint: BP follow up   History of Present Illness:   Jon Collins is a 46 y.o. male with hx of PAF, HTN,, HLD and  chronic diastolic CHF presents for follow up.   Episode of afib in 2013. He complained of palpitation when last seen by Dr. Elease HashimotoNahser 11/2015. Discussed 30 days monitor however deferred by patient.   Recent elevated BP. Improved on hight dose of coreg.  Here today for follow up.  Past Medical History:  Diagnosis Date  . Anxiety   . HTN (hypertension)   . Lone atrial fibrillation (HCC)    a. Dx 04/2012 in ER. Event monitor 07/2012 for palpitations - pt triggers correlated with NSR only.  . MVP (mitral valve prolapse)    a. Pt reported dx at age 46. 2D echo 05/2012: EF 55-60%, no RWMA, grade 2 diastolic dysfunction, no significant valvular abnormalities.  . Sleep apnea     Past Surgical History:  Procedure Laterality Date  . NO PAST SURGERIES      Current Medications: Prior to Admission medications   Medication Sig Start Date End Date Taking? Authorizing Provider  aspirin EC 81 MG tablet Take 81 mg by mouth daily.    [provider]  atorvastatin (LIPITOR) 20 MG tablet TAKE ONE TABLET BY MOUTH ONCE DAILY 05/16/16   Nahser, Deloris PingPhilip J, MD  carvedilol (COREG) 12.5 MG tablet TAKE 1 TABLET BY MOUTH TWICE DAILY WITH A MEAL 12/29/16   Nahser, Deloris PingPhilip J, MD  hydrochlorothiazide (HYDRODIURIL) 25 MG tablet TAKE ONE TABLET BY MOUTH ONCE DAILY 06/30/16   Nahser, Deloris PingPhilip J, MD  KLOR-CON M20 20 MEQ tablet TAKE ONE TABLET BY MOUTH ONCE DAILY 06/30/16   Nahser, Deloris PingPhilip J, MD  potassium chloride SA (K-DUR,KLOR-CON) 20 MEQ tablet Take 1 tablet (20 mEq total) by mouth 2 (two) times daily. 12/02/15   Nahser, Deloris PingPhilip J, MD    Allergies:   Patient has no known allergies.   Social History   Social History  .  Marital status: Single    Spouse name: N/A  . Number of children: N/A  . Years of education: N/A   Social History Main Topics  . Smoking status: Never Smoker  . Smokeless tobacco: Never Used  . Alcohol use No  . Drug use: No  . Sexual activity: Not on file   Other Topics Concern  . Not on file   Social History Narrative  . No narrative on file     Family History:  The patient's family history includes Hyperlipidemia in his father; Stroke in his maternal grandmother. ***  ROS:   Please see the history of present illness.    ROS All other systems reviewed and are negative.   PHYSICAL EXAM:   VS:  There were no vitals taken for this visit.   GEN: Well nourished, well developed, in no acute distress  HEENT: normal  Neck: no JVD, carotid bruits, or masses Cardiac: ***RRR; no murmurs, rubs, or gallops,no edema  Respiratory:  clear to auscultation bilaterally, normal work of breathing GI: soft, nontender, nondistended, + BS MS: no deformity or atrophy  Skin: warm and dry, no rash Neuro:  Alert and Oriented x 3, Strength and sensation are intact Psych: euthymic mood, full affect  Wt Readings from Last 3 Encounters:  04/14/15 182 lb (  82.6 kg)  06/13/14 194 lb 6.4 oz (88.2 kg)  03/03/14 191 lb (86.6 kg)      Studies/Labs Reviewed:   EKG:  EKG is ordered today.  The ekg ordered today demonstrates ***  Recent Labs: No results found for requested labs within last 8760 hours.   Lipid Panel    Component Value Date/Time   CHOL 197 12/01/2015 0856   TRIG 111 12/01/2015 0856   HDL 46 12/01/2015 0856   CHOLHDL 4.3 12/01/2015 0856   VLDL 22 12/01/2015 0856   LDLCALC 129 12/01/2015 0856   LDLDIRECT 176.4 02/19/2013 1143    Additional studies/ records that were reviewed today include:   Echocardiogram: 05/2012  Study Conclusions  - Left ventricle: The cavity size was normal. Wall thickness was normal. Systolic function was normal. The estimated ejection fraction  was in the range of 55% to 65%. Wall motion was normal; there were no regional wall motion abnormalities. Features are consistent with a pseudonormal left ventricular filling pattern, with concomitant abnormal relaxation and increased filling pressure (grade 2 diastolic dysfunction). - Atrial septum: No defect or patent foramen ovale was identified.    ASSESSMENT & PLAN:    1. PAF  2. HTN  3. HLD    Medication Adjustments/Labs and Tests Ordered: Current medicines are reviewed at length with the patient today.  Concerns regarding medicines are outlined above.  Medication changes, Labs and Tests ordered today are listed in the Patient Instructions below. There are no Patient Instructions on file for this visit.   Lorelei Pont, [place]  01/22/2017 2:42 PM    [facility] [address] [place], [place], [place]  [address] Phone: [phone]; Fax: [phone]

## 2017-01-24 ENCOUNTER — Ambulatory Visit: Payer: BLUE CROSS/BLUE SHIELD | Admitting: Physician Assistant

## 2017-01-29 ENCOUNTER — Telehealth: Payer: Self-pay | Admitting: Cardiology

## 2017-01-29 NOTE — Telephone Encounter (Signed)
Patient called reporting increasing symptoms of chest fluttering, and "pinching sensation in his chest mostly on the right side". States he has history of PAF and is scheduled to see Dr. Elease HashimotoNahser in August, but wanted to be seen sooner. Reports symptoms have been intermittent over the past month, no worse today. Taking his home medications as scheduled. Long conversation on the phone about possible options. He denied any chest pain, dizziness, lightheadedness or dyspnea. Discussed with the patient that I would send a message to the office to request that his appt be moved forward. ER precautions given. Pt voiced understanding and thanked me for follow up phone call.   Laverda PageLindsay Roberts Np

## 2017-01-30 ENCOUNTER — Telehealth: Payer: Self-pay | Admitting: Cardiology

## 2017-01-30 NOTE — Telephone Encounter (Signed)
F/u message  Pt returning NP call to f/u on telephone call. Please call back to discuss

## 2017-02-13 ENCOUNTER — Other Ambulatory Visit: Payer: Self-pay | Admitting: Cardiovascular Disease

## 2017-02-13 NOTE — Telephone Encounter (Signed)
Medication Detail    Disp Refills Start End   hydrochlorothiazide (HYDRODIURIL) 25 MG tablet 30 tablet 0 02/13/2017    Sig: TAKE ONE TABLET BY MOUTH ONCE DAILY   Sent to pharmacy as: hydrochlorothiazide (HYDRODIURIL) 25 MG tablet   Notes to Pharmacy: Please keep you upcoming appointment for any future refills. Thank you   E-Prescribing Status: Receipt confirmed by pharmacy (02/13/2017 10:50 AM EDT)   Pharmacy   [facility] [place], [place] - [address]

## 2017-02-17 ENCOUNTER — Other Ambulatory Visit: Payer: Self-pay | Admitting: Cardiovascular Disease

## 2017-02-17 NOTE — Telephone Encounter (Signed)
Medication Detail    Disp Refills Start End   carvedilol (COREG) 12.5 MG tablet 180 tablet 0 12/29/2016    Sig: TAKE 1 TABLET BY MOUTH TWICE DAILY WITH A MEAL   Sent to pharmacy as: carvedilol (COREG) 12.5 MG tablet   Notes to Pharmacy: KEEP UPCOMING APPT   E-Prescribing Status: Receipt confirmed by pharmacy (12/29/2016 9:30 AM EDT)   [facility]   [facility] - [place], [place] - [address]

## 2017-02-22 ENCOUNTER — Ambulatory Visit: Payer: BLUE CROSS/BLUE SHIELD | Admitting: Physician Assistant

## 2017-03-01 ENCOUNTER — Ambulatory Visit: Payer: BLUE CROSS/BLUE SHIELD | Admitting: Physician Assistant

## 2017-03-15 ENCOUNTER — Ambulatory Visit: Payer: BLUE CROSS/BLUE SHIELD | Admitting: Physician Assistant

## 2017-03-23 ENCOUNTER — Ambulatory Visit: Payer: BLUE CROSS/BLUE SHIELD | Admitting: Cardiovascular Disease

## 2017-04-10 ENCOUNTER — Other Ambulatory Visit: Payer: Self-pay | Admitting: Cardiovascular Disease

## 2017-10-27 ENCOUNTER — Encounter: Payer: Self-pay | Admitting: Cardiovascular Disease

## 2017-11-07 ENCOUNTER — Ambulatory Visit: Payer: BLUE CROSS/BLUE SHIELD | Admitting: Cardiovascular Disease

## 2017-11-07 DIAGNOSIS — R7989 Other specified abnormal findings of blood chemistry: Secondary | ICD-10-CM

## 2017-11-07 HISTORY — DX: Other specified abnormal findings of blood chemistry: R79.89

## 2017-11-09 DIAGNOSIS — R6882 Decreased libido: Secondary | ICD-10-CM | POA: Insufficient documentation

## 2017-11-09 HISTORY — DX: Decreased libido: R68.82

## 2017-11-28 ENCOUNTER — Ambulatory Visit: Payer: BLUE CROSS/BLUE SHIELD | Admitting: Cardiovascular Disease

## 2018-01-02 ENCOUNTER — Ambulatory Visit: Payer: BLUE CROSS/BLUE SHIELD | Admitting: Physician Assistant

## 2018-02-13 NOTE — Progress Notes (Signed)
Cardiology Office Note:    Date:  02/14/2018   ID:  Jon Collins, DOB 1970/08/07, MRN [id number]  [facility]  [facility]  Cardiologist:  Kristeen Miss, MD   Referring MD: [facility]   Chief Complaint  Patient presents with  . Follow-up    Atrial fibrillation    History of Present Illness:    Jon Collins is a 47 y.o. male with paroxysmal atrial fibrillation, hypertension, obstructive sleep apnea.  CHADS2-VASc=1 (HTN).  Therefore, he is not on anticoagulation.  He was last seen by Dr. Delane Ginger in 11/2015.     [name] returns for follow-up on paroxysmal atrial fibrillation.  He is here alone.  He was seen in the emergency room last year for chest pain.  Cardiac enzymes are negative.  He saw his primary care physician and was placed on sertraline.  It was thought that his symptoms are related to anxiety.  He has felt well since then without recurrent chest symptoms.  He denies exertional chest pain, significant dyspnea exertion, syncope, parous nocturnal dyspnea or leg swelling.  He uses CPAP nightly.  He denies any recurrent palpitations.  Prior CV studies:   The following studies were reviewed today:  Echo (05/2012):   EF 55-65%, no RWMA, Gr 2 DD  Past Medical History:  Diagnosis Date  . Anxiety   . HTN (hypertension)   . Lone atrial fibrillation (HCC)    a. Dx 04/2012 in ER. Event monitor 07/2012 for palpitations - pt triggers correlated with NSR only.  . MVP (mitral valve prolapse)    a. Pt reported dx at age 51. 2D echo 05/2012: EF 55-60%, no RWMA, grade 2 diastolic dysfunction, no significant valvular abnormalities.  . Sleep apnea    Surgical Hx: The patient  has a past surgical history that includes No past surgeries.   Current Medications: Current Meds  Medication Sig  . aspirin EC 81 MG tablet Take 81 mg by mouth daily.  Marland Kitchen atorvastatin (LIPITOR) 20 MG tablet TAKE ONE TABLET BY MOUTH ONCE DAILY  . carvedilol (COREG) 12.5 MG tablet  TAKE 1 TABLET BY MOUTH TWICE DAILY WITH A MEAL  . hydrochlorothiazide (HYDRODIURIL) 25 MG tablet TAKE ONE TABLET BY MOUTH ONCE DAILY  . KLOR-CON M20 20 MEQ tablet TAKE ONE TABLET BY MOUTH ONCE DAILY  . sertraline (ZOLOFT) 50 MG tablet sertraline 50 mg tablet     Allergies:   Patient has no known allergies.   Social History   Tobacco Use  . Smoking status: Never Smoker  . Smokeless tobacco: Never Used  Substance Use Topics  . Alcohol use: No    Alcohol/week: 0.0 oz  . Drug use: No     Family Hx: The patient's family history includes Hyperlipidemia in his father; Stroke in his maternal grandmother. There is no history of Heart attack.  ROS:   Please see the history of present illness.    ROS All other systems reviewed and are negative.   EKGs/Labs/Other Test Reviewed:    EKG:  EKG is  ordered today.  The ekg ordered today demonstrates normal sinus rhythm, heart rate 69, normal axis, LVH, repolarization abnormality across the precordial leads, QTC 417.  When compared to prior tracing 04/14/2015, there is no significant change.  Recent Labs: No results found for requested labs within last 8760 hours.   Recent Lipid Panel Lab Results  Component Value Date/Time   CHOL 197 12/01/2015 08:56 AM   TRIG 111 12/01/2015 08:56 AM  HDL 46 12/01/2015 08:56 AM   CHOLHDL 4.3 12/01/2015 08:56 AM   LDLCALC 129 12/01/2015 08:56 AM   LDLDIRECT 176.4 02/19/2013 11:43 AM    Physical Exam:    VS:  BP 120/86   Pulse 69   Ht 5\' 8"  (1.727 m)   Wt 210 lb 1.9 oz (95.3 kg)   SpO2 97%   BMI 31.95 kg/m     Wt Readings from Last 3 Encounters:  02/14/18 210 lb 1.9 oz (95.3 kg)  04/14/15 182 lb (82.6 kg)  06/13/14 194 lb 6.4 oz (88.2 kg)     Physical Exam  Constitutional: He is oriented to person, place, and time. He appears well-developed and well-nourished. No distress.  HENT:  Head: Normocephalic and atraumatic.  Neck: Neck supple. No JVD present. Carotid bruit is not present.    Cardiovascular: Normal rate, regular rhythm, S1 normal and S2 normal.  No murmur heard. Pulmonary/Chest: Breath sounds normal. He has no rales.  Abdominal: Soft. There is no hepatomegaly.  Musculoskeletal: He exhibits no edema.  Neurological: He is alert and oriented to person, place, and time.  Skin: Skin is warm and dry.    ASSESSMENT & PLAN:    Paroxysmal atrial fibrillation (HCC) No apparent recurrence.  CHADS2-VASc=1.  He does not require anticoagulation.  Continue ASA.  He does have an abnormal EKG with early repolarization.  This is unchanged over the past several years.  I will provide him with a copy of his EKG today so that he can carry it with him.  Follow-up 1 year.  Essential hypertension The patient's blood pressure is controlled on his current regimen.  Continue current therapy.    Dispo:  Return in about 1 year (around 02/15/2019) for Routine Follow Up, w/ Dr. Elease HashimotoNahser, or Tereso NewcomerScott Weaver, PA-C.   Medication Adjustments/Labs and Tests Ordered: Current medicines are reviewed at length with the patient today.  Concerns regarding medicines are outlined above.  Tests Ordered: Orders Placed This Encounter  Procedures  . EKG 12-Lead   Medication Changes: No orders of the defined types were placed in this encounter.   Signed, Tereso NewcomerScott Weaver, PA-C  02/14/2018 8:42 AM    [facility] [address], [place], [place]  [address] Phone: [phone]; Fax: [phone]

## 2018-02-14 ENCOUNTER — Encounter: Payer: Self-pay | Admitting: Physician Assistant

## 2018-02-14 ENCOUNTER — Ambulatory Visit (INDEPENDENT_AMBULATORY_CARE_PROVIDER_SITE_OTHER): Payer: 59 | Admitting: Physician Assistant

## 2018-02-14 ENCOUNTER — Encounter

## 2018-02-14 VITALS — BP 120/86 | HR 69 | Ht 68.0 in | Wt 210.1 lb

## 2018-02-14 DIAGNOSIS — I480 Paroxysmal atrial fibrillation: Secondary | ICD-10-CM

## 2018-02-14 DIAGNOSIS — I10 Essential (primary) hypertension: Secondary | ICD-10-CM | POA: Diagnosis not present

## 2018-02-14 NOTE — Patient Instructions (Signed)
Medication Instructions:  1. Your physician recommends that you continue on your current medications as directed. Please refer to the Current Medication list given to you today.   Labwork: NONE ORDERED TODAY  Testing/Procedures: NONE ORDERED TODAY  Follow-Up: DR. Elease HashimotoNAHSER IN 1 YEAR   Any Other Special Instructions Will Be Listed Below (If Applicable).     If you need a refill on your cardiac medications before your next appointment, please call your pharmacy.

## 2018-07-18 ENCOUNTER — Telehealth: Payer: Self-pay | Admitting: Physician Assistant

## 2018-07-18 NOTE — Telephone Encounter (Signed)
New Message   Patient c/o Palpitations:  High priority if patient c/o lightheadedness, shortness of breath, or chest pain  1) How long have you had palpitations/irregular HR/ Afib? Are you having the symptoms now? Light heart flutters for a couple of weeks  2) Are you currently experiencing lightheadedness, SOB or CP? no  3) Do you have a history of afib (atrial fibrillation) or irregular heart rhythm? yes  4) Have you checked your BP or HR? (document readings if available): went to the pcp today  5) Are you experiencing any other symptoms? Just some mild discomfort

## 2018-07-18 NOTE — Telephone Encounter (Signed)
Spoke with the pt and he is reporting to feel his heart is constantly beating "fast".. he denies sob, dizziness, and chest pain but just very aware that his heart is beating hard. He saw his PMD this morning at [facility] and they did and EKG and it was "normal" according to his MD there. He is requesting to be seen in the next few days just for peace of mind.. I offered him an appt tomorrow but he declined since he could only do very late in the day or very early... the schedulers found him an appt for 07/24/18 at 8am at the [facility] office with Joni Reining NP but he will call back if his symptoms worsen or change and he was also given the option to go to the ER if anything worsens.. pt verbalized understanding and agrees.

## 2018-07-23 NOTE — Progress Notes (Signed)
Cardiology Office Note   Date:  07/24/2018   ID:  [name], DOB 1970/11/09, MRN [id number]  PCP:  [facility]  Cardiologist: Dr. Elease Hashimoto     Chief Complaint  Patient presents with  . Atrial Fibrillation  . Hypertension     History of Present Illness: Kendahl Denino is a 48 y.o. male who presents for ongoing assessment and management of PAF, CHADS Score of 1, not on anticoagulation, HTN, with other history to include OSA and anxiety.  Last seen in the office by Tereso Newcomer, PA on  02/24/2018. He was stable from cardiac standpoint. No changes were made to his regimen.  He called our office on 1/08/20120 with complaints of rapid HR. EKG was normal at PCP office earlier that day. He made appointment for follow up today.  He states the rapid HR has subsided over the last few days. He reports that he does not drink any caffeine but is under a great deal of stress in his job. He is compliant with his CPAP.   Past Medical History:  Diagnosis Date  . Anxiety   . HTN (hypertension)   . Lone atrial fibrillation (HCC)    a. Dx 04/2012 in ER. Event monitor 07/2012 for palpitations - pt triggers correlated with NSR only.  . MVP (mitral valve prolapse)    a. Pt reported dx at age 9. 2D echo 05/2012: EF 55-60%, no RWMA, grade 2 diastolic dysfunction, no significant valvular abnormalities.  . Sleep apnea     Past Surgical History:  Procedure Laterality Date  . NO PAST SURGERIES       Current Outpatient Medications  Medication Sig Dispense Refill  . aspirin EC 81 MG tablet Take 81 mg by mouth daily.    Marland Kitchen atorvastatin (LIPITOR) 20 MG tablet TAKE ONE TABLET BY MOUTH ONCE DAILY 90 tablet 1  . carvedilol (COREG) 12.5 MG tablet TAKE 1 TABLET BY MOUTH TWICE DAILY WITH A MEAL 180 tablet 0  . hydrochlorothiazide (HYDRODIURIL) 25 MG tablet TAKE ONE TABLET BY MOUTH ONCE DAILY 30 tablet 0  . KLOR-CON M20 20 MEQ tablet TAKE ONE TABLET BY MOUTH ONCE DAILY 30 tablet 0  .  potassium chloride SA (K-DUR,KLOR-CON) 20 MEQ tablet Take 1 tablet (20 mEq total) by mouth 2 (two) times daily. 60 tablet 11  . sertraline (ZOLOFT) 50 MG tablet sertraline 50 mg tablet     No current facility-administered medications for this visit.     Allergies:   Amoxicillin    Social History:  The patient  reports that he has never smoked. He has never used smokeless tobacco. He reports that he does not drink alcohol or use drugs.   Family History:  The patient's family history includes Hyperlipidemia in his father; Stroke in his maternal grandmother.    ROS: All other systems are reviewed and negative. Unless otherwise mentioned in H&P    PHYSICAL EXAM: VS:  BP 128/84   Pulse 75   Ht 5\' 8"  (1.727 m)   Wt 215 lb 6.4 oz (97.7 kg)   BMI 32.75 kg/m  , BMI Body mass index is 32.75 kg/m. GEN: Well nourished, well developed, in no acute distress HEENT: normal Neck: no JVD, carotid bruits, or masses Cardiac: RRR; no murmurs, rubs, or gallops,no edema  Respiratory:  Clear to auscultation bilaterally, normal work of breathing GI: soft, nontender, nondistended, + BS MS: no deformity or atrophy Skin: warm and dry, no rash Neuro:  Strength and sensation are intact Psych:  euthymic mood, full affect   EKG:  .NSR with early repolarization   Recent Labs: No results found for requested labs within last 8760 hours.    Lipid Panel    Component Value Date/Time   CHOL 197 12/01/2015 0856   TRIG 111 12/01/2015 0856   HDL 46 12/01/2015 0856   CHOLHDL 4.3 12/01/2015 0856   VLDL 22 12/01/2015 0856   LDLCALC 129 12/01/2015 0856   LDLDIRECT 176.4 02/19/2013 1143      Wt Readings from Last 3 Encounters:  07/24/18 215 lb 6.4 oz (97.7 kg)  02/14/18 210 lb 1.9 oz (95.3 kg)  04/14/15 182 lb (82.6 kg)      Other studies Reviewed: Echocardiogram 05/16/2012 Left ventricle: The cavity size was normal. Wall thickness was normal. Systolic function was normal. The  estimated ejection fraction was in the range of 55% to 65%. Wall motion was normal; there were no regional wall motion abnormalities. Features are consistent with a pseudonormal left ventricular filling pattern, with concomitant abnormal relaxation and increased filling pressure (grade 2 diastolic dysfunction). - Atrial septum: No defect or patent foramen ovale was identified.  ASSESSMENT AND PLAN:  1.PAF: CHADS VASC score of 1 (HTN), He is feeling better at this appointment and has not had any more racing HR symptoms. He is advised that he can take an additional 1/2 tablet of carvedilol should racing HR occur sustained or cause symptoms. He is in a stressful job and admits that it gets to him sometimes causing anxiety, He will see us again in 6 months or sooner if symptoms persist.  ' 2. Hypertension:  Currently well controlled today. No changes in his regimen.   3. OSA: Compliant with CPAP.   Current medicines are reviewed at length with the patient today.    Labs/ tests ordered today include: None.  Bettey MareKathryn M. Liborio NixonLawrence DNP, ANP, [facility]   07/24/2018 11:26 AM    [facility] [address] Office [phone] Fax [phone]

## 2018-07-24 ENCOUNTER — Encounter: Payer: Self-pay | Admitting: Adult Health

## 2018-07-24 ENCOUNTER — Ambulatory Visit (INDEPENDENT_AMBULATORY_CARE_PROVIDER_SITE_OTHER): Payer: 59 | Admitting: Adult Health

## 2018-07-24 ENCOUNTER — Encounter (INDEPENDENT_AMBULATORY_CARE_PROVIDER_SITE_OTHER): Payer: Self-pay

## 2018-07-24 VITALS — BP 128/84 | HR 75 | Ht 68.0 in | Wt 215.4 lb

## 2018-07-24 DIAGNOSIS — G4733 Obstructive sleep apnea (adult) (pediatric): Secondary | ICD-10-CM | POA: Diagnosis not present

## 2018-07-24 DIAGNOSIS — I480 Paroxysmal atrial fibrillation: Secondary | ICD-10-CM

## 2018-07-24 DIAGNOSIS — R002 Palpitations: Secondary | ICD-10-CM | POA: Diagnosis not present

## 2018-07-24 DIAGNOSIS — I10 Essential (primary) hypertension: Secondary | ICD-10-CM

## 2018-07-24 NOTE — Patient Instructions (Addendum)
SPECIAL INSTRUCTIONS:  OK TO TAKE EXTRA 1/2 CARVEDILOL FOR RAPID HEART-RATE PLEASE CALL WITH ANY FUTURE CONCERNS  Follow-Up: You will need a follow up appointment in:  6 months.  Please call our office 2 months (MAY 2020)  in advance to schedule this (July 2020) appointment.  You may see Kristeen Miss, MD Joni Reining, DNP, AACC  or one of the following Advanced Practice Providers on your designated Care Team:  Tereso Newcomer, PA-C   Vin [place], [place]   Berton Bon, NP      Medication Instructions:  NO CHANGES- Your physician recommends that you continue on your current medications as directed. Please refer to the Current Medication list given to you today. If you need a refill on your cardiac medications before your next appointment, please call your pharmacy.  Labwork: When you have labs (blood work) and your tests are completely normal, you will receive your results ONLY by MyChart Message (if you have MyChart) -OR- A paper copy in the mail.  At [facility], you and your health needs are our priority.  As part of our continuing mission to provide you with exceptional heart care, we have created designated Provider Care Teams.  These Care Teams include your primary Cardiologist (physician) and Advanced Practice Providers (APPs -  Physician Assistants and Nurse Practitioners) who all work together to provide you with the care you need, when you need it.  Thank you for choosing [facility] at [facility]!!

## 2018-10-11 ENCOUNTER — Encounter: Payer: Self-pay | Admitting: Physician Assistant

## 2018-10-11 ENCOUNTER — Other Ambulatory Visit: Payer: Self-pay | Admitting: Physician Assistant

## 2018-10-11 ENCOUNTER — Telehealth: Payer: Self-pay | Admitting: Physician Assistant

## 2018-10-11 MED ORDER — CARVEDILOL 12.5 MG PO TABS: 18.7500 mg | ORAL_TABLET | Freq: Two times a day (BID) | ORAL | 3 refills | Status: DC

## 2018-10-11 NOTE — Telephone Encounter (Signed)
Pt called because he has been having more palpitations.  No presyncope or syncope.   He is concerned because they are more frequent now, haven't been this bad in a long time.   The palpitations make him anxious.   Says he has been taking Coreg 12.5 mg, 2 tabs q am. None pm.  Has taken allergy medications recently, not in the last day or 2. No recent illness, no high amounts of caffeine.   Plan: emphasized that Coreg is a bid med. Increased dose to 18.75 mg bid and added extra tabs to take prn.  Call us if sx do not improve.  Discussed [employer] w/ him, that may help.  Theodore Demark, PA-C 10/11/2018 9:13 PM Beeper [phone]

## 2018-10-14 ENCOUNTER — Telehealth: Payer: Self-pay | Admitting: Cardiovascular Disease

## 2018-10-14 NOTE — Telephone Encounter (Signed)
Patient called because he has been having palpitations.  He is currently in the [facility] ED being evaluated.    I recommended he call our office tomorrow to have a follow up appointment scheduled after he is discharged from the ED.

## 2018-10-15 ENCOUNTER — Telehealth: Payer: Self-pay | Admitting: Cardiovascular Disease

## 2018-10-15 NOTE — Telephone Encounter (Signed)
Pt called. He is a pt of Dr. Elease Hashimoto. He went to [facility] in [facility] yesterday with palpitations. He called the weekend on call doctor, and the on call doctor recommended he be seen.  He was put on a new medication  Diltiazem ER (cartia XT 180mg ) He does not want to do a televisit, he wants to be seen in person.  He took his first dose along with his Carvedilol today.   He has also had an irregular BP

## 2018-10-15 NOTE — Telephone Encounter (Signed)
Agree that he should take the Kdur 20 BID as prescribed. He has a hx of PAF in the past . The Coreg + Diltiazem should help control this

## 2018-10-15 NOTE — Telephone Encounter (Signed)
New Message   Pt c/o medication issue:  1. Name of Medication: Diltiazem   2. How are you currently taking this medication (dosage and times per day)? 180mg  once per day   3. Are you having a reaction (difficulty breathing--STAT)? NO  4. What is your medication issue? Patient wants to talk to a nurse about the effects of the medication and when should he feel a difference.

## 2018-10-15 NOTE — Telephone Encounter (Signed)
Called patient who states he was evaluated at [facility] yesterday for worsening palpitations and fluctuations in BP. He was prescribed Diltiazem 180 mg daily which he has started. I asked if he was told what his ecg showed and he states he was monitored and that the provider did see something on the monitor but he does not remember what it was called. He has a hx of PVCs. Lab work from yesterday reveals serum K+ of 3.5 mmol/L. I asked patient if he is taking kdur 20 mEq twice daily and he denies; states he has always taken 20 mEq once daily. States he took medications today at 0600.  0830 BP 135/95 mmHg Rt arm, Lt arm 126/88 mmHg, pulse 84 I advised that he will likely have less PVCs with the higher dose kdur and starting the diltiazem. He will increase kdur to 20 mEq twice daily.  I advised him to monitor BP and record BP and pulse and that I will call him back Thursday to see how he is doing. He verbalized understanding and agreement and states he gets anxious when he has these palpitations but feels better after talking to me. I advised him to call sooner with questions or concerns and he thanked me for the call.

## 2018-10-16 ENCOUNTER — Telehealth: Payer: Self-pay | Admitting: Cardiovascular Disease

## 2018-10-16 ENCOUNTER — Telehealth: Payer: Self-pay | Admitting: Student

## 2018-10-16 DIAGNOSIS — R002 Palpitations: Secondary | ICD-10-CM

## 2018-10-16 NOTE — Telephone Encounter (Signed)
Please disregard previous message sent in error °

## 2018-10-16 NOTE — Telephone Encounter (Signed)
New message  Patient is returning a call in reference to setting up a virtual appt. Please call to discuss.

## 2018-10-16 NOTE — Telephone Encounter (Signed)
Follow up   Patient is returning call in reference to setting up a virtual appt. Please call to discuss.

## 2018-10-16 NOTE — Telephone Encounter (Signed)
Called patient back regarding medication question. He called our on-call provider and spoke with Marjie Skiff, PA this morning about missing a dose of carvedilol last night. She advised him regarding his medication schedule and I advised that it will likely take 5 days of consistent therapy on the diltiazem plus the carvedilol for him to notice improvement. He states he continues to feel anxious when he has the palpitations but he is thankful for the new medication advice and he will continue to work on decreasing his anxiety. I advised that I will call him back on Thursday to see how he is feeling. He verbalized understanding and agreement and was appreciative of my call.

## 2018-10-16 NOTE — Telephone Encounter (Signed)
   Patient called Answering Service about palpitations and missed medications dose. Patient on Coreg 18.75mg  (1.5 tablet of 12.5mg ) twice daily and recently was started on Diltiazem 180mg  daily. Patient normally takes his Coreg at 6am and 6pm but fell asleep last night and did not take his evening dose until 2am. Patient still having palpitations this morning and wanted to know if it was okay for him to take his morning dose of Coreg. No chest pain or shortness of breath. Patient checked his BP and heart rate for me while he was on the phone. BP 133/97 and pulse 73 bpm. Recommended  patient take 1 tablet (12.5mg ) of Coreg at around 8am and then go back to his regular schedule. Patient wants to be seen in the office. However, I explained that due to the COVID-19 pandemic we are limiting office visits. He understands and agreed to a telephone visit. Will send message to schedulers. Patient agreeable to plan.  Corrin Parker, PA-C 10/16/2018 7:23 AM

## 2018-10-18 ENCOUNTER — Telehealth: Payer: Self-pay | Admitting: *Deleted

## 2018-10-18 NOTE — Telephone Encounter (Signed)
Patient called to inform he has been enrolled for [employer] to ship a 30 day cardiac event monitor to his home.  [employer] will call to confirm shipping address.  Instructions briefly reviewed as the are included in the monitor kit.  Patient will need to call [employer] to baseline once he has applied the monitor.  They will be available by phone 24/7 for supply requests or questions/ concerns regarding the monitor.

## 2018-10-18 NOTE — Telephone Encounter (Signed)
Follow up   Patient is following up to he wants to discuss information about the virtual visit. Please call.

## 2018-10-18 NOTE — Telephone Encounter (Signed)
Spoke with patient who states he continues to have palpitations despite starting the diltiazem on Monday. He states he does not feel like himself and wants to return to normal. Has hx of PAF but states every ecg he has had over the past few years shows SR with PVCs. He states it is making him anxious. I advised him that we will order an event monitor and schedule an with Dr. Elease Hashimoto for next week. Patient admits to healthy diet and good hydration. I advised him to monitor caloric intake as he may not be consuming enough calories daily. He is scheduled for virtual visit with Dr. Elease Hashimoto on 4/16 and I will ask our team to get a monitor mailed out to the patient. The patient thanked me for our help.

## 2018-10-22 ENCOUNTER — Telehealth: Payer: Self-pay | Admitting: Cardiovascular Disease

## 2018-10-22 NOTE — Telephone Encounter (Signed)
Spoke with patient regarding 10/25/2018 virtual visit.  Confirmed all demographics.  He uses [employer] for his work so he's very familiar.  He actively uses mychart.  Has smart phone.

## 2018-10-23 ENCOUNTER — Encounter (INDEPENDENT_AMBULATORY_CARE_PROVIDER_SITE_OTHER): Payer: 59

## 2018-10-23 DIAGNOSIS — R002 Palpitations: Secondary | ICD-10-CM | POA: Diagnosis not present

## 2018-10-25 ENCOUNTER — Telehealth (INDEPENDENT_AMBULATORY_CARE_PROVIDER_SITE_OTHER): Payer: 59 | Admitting: Cardiovascular Disease

## 2018-10-25 ENCOUNTER — Encounter: Payer: Self-pay | Admitting: Cardiovascular Disease

## 2018-10-25 ENCOUNTER — Other Ambulatory Visit: Payer: Self-pay

## 2018-10-25 VITALS — BP 124/89 | HR 70 | Ht 68.0 in

## 2018-10-25 DIAGNOSIS — Z7189 Other specified counseling: Secondary | ICD-10-CM

## 2018-10-25 DIAGNOSIS — I10 Essential (primary) hypertension: Secondary | ICD-10-CM

## 2018-10-25 DIAGNOSIS — I480 Paroxysmal atrial fibrillation: Secondary | ICD-10-CM | POA: Diagnosis not present

## 2018-10-25 DIAGNOSIS — E876 Hypokalemia: Secondary | ICD-10-CM

## 2018-10-25 DIAGNOSIS — R002 Palpitations: Secondary | ICD-10-CM

## 2018-10-25 NOTE — Patient Instructions (Addendum)
Medication Instructions:  Your physician recommends that you continue on your current medications as directed. Please refer to the Current Medication list given to you today.  If you need a refill on your cardiac medications before your next appointment, please call your pharmacy.    Lab work: Your physician recommends that you return for lab work in: 1 month for basic metabolic panel on Tuesday May 19. You may come in anytime after 7:30 am. You do not have to fast. Please call our office to reschedule the date if needed.    Testing/Procedures: None Ordered   Follow-Up: At [facility], you and your health needs are our priority.  As part of our continuing mission to provide you with exceptional heart care, we have created designated Provider Care Teams.  These Care Teams include your primary Cardiologist (physician) and Advanced Practice Providers (APPs -  Physician Assistants and Nurse Practitioners) who all work together to provide you with the care you need, when you need it. You will need a follow up appointment in:  4 months on Friday April 21 with Kristeen Miss, MD or one of the following Advanced Practice Providers on your designated Care Team: Tereso Newcomer, PA-C [place], [place] . Berton Bon, NP

## 2018-10-25 NOTE — Progress Notes (Signed)
Virtual Visit via Video Note   This visit type was conducted due to national recommendations for restrictions regarding the COVID-19 Pandemic (e.g. social distancing) in an effort to limit this patient's exposure and mitigate transmission in our community.  Due to his co-morbid illnesses, this patient is at least at moderate risk for complications without adequate follow up.  This format is felt to be most appropriate for this patient at this time.  All issues noted in this document were discussed and addressed.  A limited physical exam was performed with this format.  Please refer to the patient's chart for his consent to telehealth for [facility].   Evaluation Performed:  Follow-up visit  Date:  10/25/2018   ID:  Jon Collins, DOB 1970-08-07, MRN [id number]  Patient Location: Home Provider Location: Home  PCP:  [facility]  Cardiologist:  Kristeen Miss, MD  Electrophysiologist:  None   Chief Complaint:  Palpitations   History of Present Illness:    Jon Collins is a 48 y.o. male with a history of paroxysmal atrial fibrillation and hypertension.  He also has a history of obstructive sleep apnea.  He is not on anticoagulation.  He presents today for further evaluation of increasing palpitations.  He has been to the emergency room twice this month already.  He presented on April 5 for further evaluation of palpitations.  He returned on April 12 with abdominal pain and symptoms consistent with ileus.  He has had more palpitations. Woke up with more palpitatons.   lke a puase  May last several minutes.   Was placed on Coreg along with Dilt He was previously taking coreg only 1 a day  Now is on 18.75 mg PO BID  Potassium was found to be low - kdur was increased  Has a event monitor  No cough, fever, no aches   The patient does not have symptoms concerning for COVID-19 infection (fever, chills, cough, or new shortness of breath).    Past Medical  History:  Diagnosis Date  . Anxiety   . HTN (hypertension)   . Lone atrial fibrillation (HCC)    a. Dx 04/2012 in ER. Event monitor 07/2012 for palpitations - pt triggers correlated with NSR only.  . MVP (mitral valve prolapse)    a. Pt reported dx at age 65, NOT seen on echo. 2D echo 05/2012: EF 55-60%, no RWMA, grade 2 diastolic dysfunction, no significant valvular abnormalities.  . Sleep apnea    Past Surgical History:  Procedure Laterality Date  . NO PAST SURGERIES       Current Meds  Medication Sig  . aspirin EC 81 MG tablet Take 81 mg by mouth daily.  Marland Kitchen atorvastatin (LIPITOR) 20 MG tablet TAKE ONE TABLET BY MOUTH ONCE DAILY  . carvedilol (COREG) 12.5 MG tablet Take 18.75 mg by mouth 2 (two) times daily with a meal.  . diltiazem (CARTIA XT) 180 MG 24 hr capsule Take 180 mg by mouth daily.  . hydrochlorothiazide (HYDRODIURIL) 25 MG tablet TAKE ONE TABLET BY MOUTH ONCE DAILY  . potassium chloride SA (K-DUR,KLOR-CON) 20 MEQ tablet Take 40 mEq by mouth once.   . tadalafil (CIALIS) 5 MG tablet Take 1 tablet by mouth as needed.  . testosterone cypionate (DEPO-TESTOSTERONE) 100 MG/ML injection Depo-Testosterone 100 mg/mL intramuscular oil  Inject 1 mL every 2 weeks by intramuscular route.  pls pick up medication and call office to schedule a nurse visit for injection q 2 wks.  Bring medication with  you to the appt.     Allergies:   Amoxicillin   Social History   Tobacco Use  . Smoking status: Never Smoker  . Smokeless tobacco: Never Used  Substance Use Topics  . Alcohol use: No    Alcohol/week: 0.0 standard drinks  . Drug use: No     Family Hx: The patient's family history includes Hyperlipidemia in his father; Stroke in his maternal grandmother. There is no history of Heart attack.  ROS:   Please see the history of present illness.     All other systems reviewed and are negative.   Prior CV studies:   The following studies were reviewed today:    Labs/Other Tests  and Data Reviewed:    EKG:  An ECG dated Jul 24, 2018   was personally reviewed today and demonstrated:   NSR at 75.  Early repol changes.   Recent Labs: No results found for requested labs within last 8760 hours.   Recent Lipid Panel Lab Results  Component Value Date/Time   CHOL 197 12/01/2015 08:56 AM   TRIG 111 12/01/2015 08:56 AM   HDL 46 12/01/2015 08:56 AM   CHOLHDL 4.3 12/01/2015 08:56 AM   LDLCALC 129 12/01/2015 08:56 AM   LDLDIRECT 176.4 02/19/2013 11:43 AM    Wt Readings from Last 3 Encounters:  07/24/18 215 lb 6.4 oz (97.7 kg)  02/14/18 210 lb 1.9 oz (95.3 kg)  04/14/15 182 lb (82.6 kg)     Objective:    Vital Signs:  BP 124/89 (BP Location: Right Arm, Patient Position: Sitting, Cuff Size: Normal)   Pulse 70   Ht 5\' 8"  (1.727 m)   BMI 32.75 kg/m    General:   Appears healthy,   NAD HEENT:   No obvious JVD or lymphadenopathy Resp:   Normal work of breathing,   resp rate is normal  CV :   BP and HR are normal ,  No edema Abd:   No abdomina swelling , Ext:   No clubbing, cyanosis, or edema  Neuro:   Alert and oriented x 3.   No obvious motor deficits Skin : no obvious rashes    ASSESSMENT & PLAN:    1. Palpitations: The patient has a history of paroxysmal atrial fibrillation.  He also has documented premature ventricular contractions.  He has increased his carvedilol to 18.75 mg twice a day.  He also is on increased potassium supplement for documented hypokalemia.  His palpitations have improved.  He has an event monitor.  At this time we will continue with his same medications.  He seems to be doing a lot better.  I will forward to seeing the event monitor results.  If he is found to have atrial fibrillation will need to consider starting anticoagulation although his CHADS2VASC is still only 1 at this time  2.   Essential HTN:   BP is well controlled. He is tolerating the higher dose of carvedilol.  Continue to follow.  COVID-19 Education: The signs and  symptoms of COVID-19 were discussed with the patient and how to seek care for testing (follow up with PCP or arrange E-visit).  The importance of social distancing was discussed today.  Time:   Today, I have spent 20 minutes with the patient with telehealth technology discussing the above problems.  10 additional minutes of chart prep and charting     Medication Adjustments/Labs and Tests Ordered: Current medicines are reviewed at length with the patient today.  Concerns regarding  medicines are outlined above.   Tests Ordered: No orders of the defined types were placed in this encounter.   Medication Changes: No orders of the defined types were placed in this encounter.   Disposition:  Follow up in 3 month(s)  Signed, Kristeen MissPhilip Chrishonda Hesch, MD  10/25/2018 8:44 AM     [facility]

## 2018-11-20 ENCOUNTER — Other Ambulatory Visit: Payer: Self-pay

## 2018-11-23 ENCOUNTER — Other Ambulatory Visit: Payer: Self-pay

## 2018-11-23 ENCOUNTER — Other Ambulatory Visit: Payer: 59 | Admitting: *Deleted

## 2018-11-23 DIAGNOSIS — E876 Hypokalemia: Secondary | ICD-10-CM

## 2018-11-23 DIAGNOSIS — R002 Palpitations: Secondary | ICD-10-CM

## 2018-11-23 DIAGNOSIS — I480 Paroxysmal atrial fibrillation: Secondary | ICD-10-CM

## 2018-11-24 ENCOUNTER — Emergency Department (HOSPITAL_COMMUNITY)
Admission: EM | Admit: 2018-11-24 | Discharge: 2018-11-24 | Disposition: A | Discharge status: Home / Self Care | Payer: 59 | Attending: Emergency Medicine | Admitting: Emergency Medicine

## 2018-11-24 ENCOUNTER — Other Ambulatory Visit: Payer: Self-pay

## 2018-11-24 ENCOUNTER — Encounter (HOSPITAL_COMMUNITY): Payer: Self-pay | Admitting: Emergency Medicine

## 2018-11-24 ENCOUNTER — Emergency Department (HOSPITAL_COMMUNITY): Payer: 59

## 2018-11-24 DIAGNOSIS — R0600 Dyspnea, unspecified: Secondary | ICD-10-CM | POA: Insufficient documentation

## 2018-11-24 DIAGNOSIS — Z79899 Other long term (current) drug therapy: Secondary | ICD-10-CM | POA: Diagnosis not present

## 2018-11-24 DIAGNOSIS — I10 Essential (primary) hypertension: Secondary | ICD-10-CM | POA: Insufficient documentation

## 2018-11-24 DIAGNOSIS — Z7982 Long term (current) use of aspirin: Secondary | ICD-10-CM | POA: Insufficient documentation

## 2018-11-24 DIAGNOSIS — R0602 Shortness of breath: Secondary | ICD-10-CM | POA: Diagnosis present

## 2018-11-24 LAB — CBC WITH DIFFERENTIAL/PLATELET
Abs Immature Granulocytes: 0 10*3/uL (ref 0.00–0.07)
Basophils Absolute: 0 10*3/uL (ref 0.0–0.1)
Basophils Relative: 1 %
Eosinophils Absolute: 0.1 10*3/uL (ref 0.0–0.5)
Eosinophils Relative: 2 %
HCT: 42.8 % (ref 39.0–52.0)
Hemoglobin: 14.6 g/dL (ref 13.0–17.0)
Immature Granulocytes: 0 %
Lymphocytes Relative: 46 %
Lymphs Abs: 1.7 10*3/uL (ref 0.7–4.0)
MCH: 29.3 pg (ref 26.0–34.0)
MCHC: 34.1 g/dL (ref 30.0–36.0)
MCV: 85.8 fL (ref 80.0–100.0)
Monocytes Absolute: 0.5 10*3/uL (ref 0.1–1.0)
Monocytes Relative: 13 %
Neutro Abs: 1.4 10*3/uL — ABNORMAL LOW (ref 1.7–7.7)
Neutrophils Relative %: 38 %
Platelets: 217 10*3/uL (ref 150–400)
RBC: 4.99 MIL/uL (ref 4.22–5.81)
RDW: 12.5 % (ref 11.5–15.5)
WBC: 3.7 10*3/uL — ABNORMAL LOW (ref 4.0–10.5)
nRBC: 0 % (ref 0.0–0.2)

## 2018-11-24 LAB — COMPREHENSIVE METABOLIC PANEL
ALT: 24 U/L (ref 0–44)
AST: 19 U/L (ref 15–41)
Albumin: 4.1 g/dL (ref 3.5–5.0)
Alkaline Phosphatase: 43 U/L (ref 38–126)
Anion gap: 12 (ref 5–15)
BUN: 9 mg/dL (ref 6–20)
CO2: 24 mmol/L (ref 22–32)
Calcium: 9.6 mg/dL (ref 8.9–10.3)
Chloride: 103 mmol/L (ref 98–111)
Creatinine, Ser: 1.02 mg/dL (ref 0.61–1.24)
GFR calc Af Amer: >60 mL/min (ref >60)
GFR calc non Af Amer: >60 mL/min (ref >60)
Glucose, Bld: 94 mg/dL (ref 70–99)
Potassium: 3.4 mmol/L — ABNORMAL LOW (ref 3.5–5.1)
Sodium: 139 mmol/L (ref 135–145)
Total Bilirubin: 0.8 mg/dL (ref 0.3–1.2)
Total Protein: 7.5 g/dL (ref 6.5–8.1)

## 2018-11-24 LAB — BASIC METABOLIC PANEL
BUN/Creatinine Ratio: 14 (ref 9–20)
BUN: 15 mg/dL (ref 6–24)
CO2: 24 mmol/L (ref 20–29)
Calcium: 9.6 mg/dL (ref 8.7–10.2)
Chloride: 103 mmol/L (ref 96–106)
Creatinine, Ser: 1.06 mg/dL (ref 0.76–1.27)
GFR calc Af Amer: 95 mL/min/{1.73_m2} (ref >59)
GFR calc non Af Amer: 82 mL/min/{1.73_m2} (ref >59)
Glucose: 86 mg/dL (ref 65–99)
Potassium: 3.8 mmol/L (ref 3.5–5.2)
Sodium: 142 mmol/L (ref 134–144)

## 2018-11-24 LAB — TROPONIN I: Troponin I: <0.03 ng/mL (ref <0.03)

## 2018-11-24 LAB — LIPASE, BLOOD: Lipase: 30 U/L (ref 11–51)

## 2018-11-24 LAB — BRAIN NATRIURETIC PEPTIDE: B Natriuretic Peptide: 15.4 pg/mL (ref 0.0–100.0)

## 2018-11-24 MED ORDER — OMEPRAZOLE 20 MG PO CPDR: 20.0000 mg | DELAYED_RELEASE_CAPSULE | Freq: Every day | ORAL | 0 refills | Status: DC

## 2018-11-24 NOTE — ED Notes (Signed)
This RN contacted [facility] to cancel omeprazole prescription per MD Knapp's request.

## 2018-11-24 NOTE — Discharge Instructions (Addendum)
Try taking the medications to see if that helps, follow up with your GI doctor regarding your recent procedures, follow up with your doctor as planned for further evaluation

## 2018-11-24 NOTE — ED Triage Notes (Signed)
Pt. Stated, Jon Collins had some SOB and my chest feels like something is sitting there and when I take a deep breath it feels like I can't get it. I  Have an appt on wed and I just can't wait til then.

## 2018-11-24 NOTE — ED Notes (Signed)
Pt updating family himself. Declines the need for RN to call.

## 2018-11-24 NOTE — ED Notes (Signed)
Patient transported to X-ray 

## 2018-11-24 NOTE — ED Notes (Signed)
Discharge instructions and prescription discussed with Pt. Pt verbalized understanding. Pt stable and ambulatory.    

## 2018-11-24 NOTE — ED Provider Notes (Signed)
[facility] EMERGENCY DEPARTMENT Provider Note   CSN: [id number] Arrival date & time: 11/24/18  1331    History   Chief Complaint Chief Complaint  Patient presents with  . Shortness of Breath  . Chest Pain    HPI Jon Collins is a 48 y.o. male.  HPI: A 48 year old patient with a history of hypertension presents for evaluation of chest pain. Initial onset of pain was more than 6 hours ago. The patient's chest pain is described as heaviness/pressure/tightness and is not worse with exertion. The patient's chest pain is not middle- or left-sided, is not well-localized, is not sharp and does not radiate to the arms/jaw/neck. The patient does not complain of nausea and denies diaphoresis. The patient has no history of stroke, has no history of peripheral artery disease, has not smoked in the past 90 days, denies any history of treated diabetes, has no relevant family history of coronary artery disease (first degree relative at less than age 26), has no history of hypercholesterolemia and does not have an elevated BMI (>=30).   HPI Pt for the last couple of weeks has been feeling short of breath.  He contacted his doctor and is going to see her on Wednesday but he did want to wait because it concerns him.  Pt has been having a mild discomfort in his upper abdomen and chest.  He feels like something is sitting there.  He constantly feel short of breath.  It waxes and wanes.  Nothing in particular makes it worse or better.  Not worse with activity or exertion.    No tobacco.  No heart or lung disease.  No trips or travel.  No fever or cough.  No leg swelling.  No burping, belching, acid reflux as far as he knows. Past Medical History:  Diagnosis Date  . Anxiety   . HTN (hypertension)   . Lone atrial fibrillation (HCC)    a. Dx 04/2012 in ER. Event monitor 07/2012 for palpitations - pt triggers correlated with NSR only.  . MVP (mitral valve prolapse)    a. Pt reported dx at  age 84, NOT seen on echo. 2D echo 05/2012: EF 55-60%, no RWMA, grade 2 diastolic dysfunction, no significant valvular abnormalities.  . Sleep apnea     Patient Active Problem List   Diagnosis Date Noted  . Hyperlipidemia 04/14/2015  . Palpitations suggestive of PVCs or PACs 10/16/2013  . Sleep apnea 10/16/2013  . A-fib (HCC) 05/15/2012  . HTN (hypertension) 05/15/2012    Past Surgical History:  Procedure Laterality Date  . NO PAST SURGERIES          Home Medications    Prior to Admission medications   Medication Sig Start Date End Date Taking? Authorizing Provider  aspirin EC 81 MG tablet Take 81 mg by mouth daily.    [provider]  atorvastatin (LIPITOR) 20 MG tablet TAKE ONE TABLET BY MOUTH ONCE DAILY 05/16/16   Nahser, Deloris Ping, MD  carvedilol (COREG) 12.5 MG tablet Take 18.75 mg by mouth 2 (two) times daily with a meal.    [provider]  diltiazem (CARTIA XT) 180 MG 24 hr capsule Take 180 mg by mouth daily.    [provider]  hydrochlorothiazide (HYDRODIURIL) 25 MG tablet TAKE ONE TABLET BY MOUTH ONCE DAILY 02/13/17   Nahser, Deloris Ping, MD  potassium chloride SA (K-DUR,KLOR-CON) 20 MEQ tablet Take 40 mEq by mouth once.     [provider]  tadalafil (  CIALIS) 5 MG tablet Take 1 tablet by mouth as needed.    [provider]  testosterone cypionate (DEPO-TESTOSTERONE) 100 MG/ML injection Depo-Testosterone 100 mg/mL intramuscular oil  Inject 1 mL every 2 weeks by intramuscular route.  pls pick up medication and call office to schedule a nurse visit for injection q 2 wks.  Bring medication with you to the appt.    [provider]    Family History Family History  Problem Relation Age of Onset  . Hyperlipidemia Father   . Stroke Maternal Grandmother   . Heart attack Neg Hx     Social History Social History   Tobacco Use  . Smoking status: Never Smoker  . Smokeless tobacco: Never Used  Substance Use Topics  .  Alcohol use: No    Alcohol/week: 0.0 standard drinks  . Drug use: No     Allergies   Amoxicillin   Review of Systems Review of Systems  All other systems reviewed and are negative.    Physical Exam Updated Vital Signs BP (!) 141/99   Pulse 60   Temp 98.8 F (37.1 C) (Oral)   Resp 13   Ht 1.727 m (5\' 8" )   Wt 92.5 kg   SpO2 99%   BMI 31.02 kg/m   Physical Exam Vitals signs and nursing note reviewed.  Constitutional:      General: He is not in acute distress.    Appearance: He is well-developed.  HENT:     Head: Normocephalic and atraumatic.     Right Ear: External ear normal.     Left Ear: External ear normal.  Eyes:     General: No scleral icterus.       Right eye: No discharge.        Left eye: No discharge.     Conjunctiva/sclera: Conjunctivae normal.  Neck:     Musculoskeletal: Neck supple.     Trachea: No tracheal deviation.  Cardiovascular:     Rate and Rhythm: Normal rate and regular rhythm.  Pulmonary:     Effort: Pulmonary effort is normal. No respiratory distress.     Breath sounds: Normal breath sounds. No stridor. No wheezing or rales.  Abdominal:     General: Bowel sounds are normal. There is no distension.     Palpations: Abdomen is soft.     Tenderness: There is no abdominal tenderness. There is no guarding or rebound.  Musculoskeletal:        General: No tenderness.  Skin:    General: Skin is warm and dry.     Findings: No rash.  Neurological:     Mental Status: He is alert.     Cranial Nerves: No cranial nerve deficit (no facial droop, extraocular movements intact, no slurred speech).     Sensory: No sensory deficit.     Motor: No abnormal muscle tone or seizure activity.     Coordination: Coordination normal.      ED Treatments / Results  Labs (all labs ordered are listed, but only abnormal results are displayed) Labs Reviewed  CBC WITH DIFFERENTIAL/PLATELET - Abnormal; Notable for the following components:      Result Value    WBC 3.7 (*)    Neutro Abs 1.4 (*)    All other components within normal limits  COMPREHENSIVE METABOLIC PANEL - Abnormal; Notable for the following components:   Potassium 3.4 (*)    All other components within normal limits  LIPASE, BLOOD  TROPONIN I  BRAIN NATRIURETIC  PEPTIDE    EKG EKG Interpretation  Date/Time:  Saturday Nov 24 2018 14:10:02 EDT Ventricular Rate:  67 PR Interval:    QRS Duration: 98 QT Interval:  390 QTC Calculation: 412 R Axis:   56 Text Interpretation:  Sinus rhythm Left ventricular hypertrophy Anterior ST elevation, probably due to LVH No significant change since last tracing Confirmed by Linwood Dibbles [phone]) on 11/24/2018 2:14:11 PM Also confirmed by Linwood Dibbles [id number]), editor Barbette Hair [phone])  on 11/24/2018 2:36:20 PM   Radiology Dg Chest Portable 1 View  Result Date: 11/24/2018 CLINICAL DATA:  Shortness of breath for 2 weeks. EXAM: PORTABLE CHEST 1 VIEW COMPARISON:  Two-view chest x-ray 10/14/2018 FINDINGS: Heart size is normal. Lungs are clear. There is no edema or effusion. No focal airspace disease is present. The visualized soft tissues and bony thorax are unremarkable. IMPRESSION: 1. Negative one-view chest x-ray Electronically Signed   By: Marin Roberts M.D.   On: 11/24/2018 14:35    Procedures Procedures (including critical care time)   Medications Ordered in ED Medications - No data to display   Initial Impression / Assessment and Plan / ED Course  I have reviewed the triage vital signs and the nursing notes.  Pertinent labs & imaging results that were available during my care of the patient were reviewed by me and considered in my medical decision making (see chart for details).  Clinical Course as of Nov 24 1539  Sat Nov 24, 2018  1352 Low risk for PE.  Perc negative   [JK]    Clinical Course User Index [JK] Linwood Dibbles, MD    HEAR Score: 3  Patient presented to the emergency room for evaluation of upper abdominal  discomfort and a sensation of shortness of breath for the last couple of weeks.  Patient does not have a history of heart disease.  No history of PE or DVT.  Patient's exam is reassuring.  He is breathing easily.  His oxygen saturation is normal.  Patient's ED work-up is unremarkable.  I have a low suspicion for acute coronary syndrome.  No PE risk factors and he is PERC negative.  Patient does have a history of atrial fibrillation but he has a normal sinus rhythm here in the emergency room.  Uncertain as to the exact etiology.  It may be acid reflux.  I will have him try a course of antacids.  He has planned follow-up with his primary care doctor this week.  Final Clinical Impressions(s) / ED Diagnoses   Final diagnoses:  Dyspnea, unspecified type    ED Discharge Orders    None       Linwood Dibbles, MD 11/24/18 1541

## 2018-11-27 ENCOUNTER — Other Ambulatory Visit: Payer: 59

## 2019-02-28 NOTE — Progress Notes (Signed)
Virtual Visit via Video Note   This visit type was conducted due to national recommendations for restrictions regarding the COVID-19 Pandemic (e.g. social distancing) in an effort to limit this patient's exposure and mitigate transmission in our community.  Due to his co-morbid illnesses, this patient is at least at moderate risk for complications without adequate follow up.  This format is felt to be most appropriate for this patient at this time.  All issues noted in this document were discussed and addressed.  A limited physical exam was performed with this format.  Please refer to the patient's chart for his consent to telehealth for [facility].   Date:  03/01/2019   ID:  Jon RobinsonsParrish Yaney, DOB 18-Nov-1970, MRN [id number]  Patient Location: Home Provider Location: Home  PCP:  Jaci LazierPa, [facility]  Cardiologist:  Kristeen MissPhilip , MD  Electrophysiologist:  None   Problem List 1. Paroxysmal atrial fibrillation 2.  Essential HTN    October 25, 2018:  Jon Collins is a 48 y.o. male with a history of paroxysmal atrial fibrillation and hypertension.  He also has a history of obstructive sleep apnea.  He is not on anticoagulation.  He presents today for further evaluation of increasing palpitations.  He has been to the emergency room twice this month already.  He presented on April 5 for further evaluation of palpitations.  He returned on April 12 with abdominal pain and symptoms consistent with ileus.  He has had more palpitations. Woke up with more palpitatons.   lke a puase  May last several minutes.   Was placed on Coreg along with Dilt He was previously taking coreg only 1 a day  Now is on 18.75 mg PO BID  Potassium was found to be low - kdur was increased  Has a event monitor  No cough, fever, no aches    Evaluation Performed:  Follow-Up Visit  Chief Complaint:  palpitations  Aug. 20, 2020    Jon Robinsonsarrish Napp is a 48 y.o. male with jPAF .  No recent  episodes of palpitations.  No cp or dyspnea.  Was in the ER Nov 24, 2018 for abdominal pain  Work up was negative     The patient does not have symptoms concerning for COVID-19 infection (fever, chills, cough, or new shortness of breath).    Past Medical History:  Diagnosis Date  . Anxiety   . HTN (hypertension)   . Lone atrial fibrillation (HCC)    a. Dx 04/2012 in ER. Event monitor 07/2012 for palpitations - pt triggers correlated with NSR only.  . MVP (mitral valve prolapse)    a. Pt reported dx at age 48, NOT seen on echo. 2D echo 05/2012: EF 55-60%, no RWMA, grade 2 diastolic dysfunction, no significant valvular abnormalities.  . Sleep apnea    Past Surgical History:  Procedure Laterality Date  . NO PAST SURGERIES       Current Meds  Medication Sig  . aspirin EC 81 MG tablet Take 81 mg by mouth daily.  Marland Kitchen. atorvastatin (LIPITOR) 20 MG tablet TAKE ONE TABLET BY MOUTH ONCE DAILY  . carvedilol (COREG) 12.5 MG tablet Take 18.75 mg by mouth 2 (two) times daily with a meal.  . diltiazem (CARTIA XT) 180 MG 24 hr capsule Take 180 mg by mouth daily.  . tadalafil (CIALIS) 5 MG tablet Take 1 tablet by mouth as needed.  . testosterone cypionate (DEPO-TESTOSTERONE) 100 MG/ML injection   . [DISCONTINUED] hydrochlorothiazide (HYDRODIURIL) 25 MG tablet TAKE  ONE TABLET BY MOUTH ONCE DAILY  . [DISCONTINUED] potassium chloride SA (K-DUR,KLOR-CON) 20 MEQ tablet Take 40 mEq by mouth once.      Allergies:   Amoxicillin   Social History   Tobacco Use  . Smoking status: Never Smoker  . Smokeless tobacco: Never Used  Substance Use Topics  . Alcohol use: No    Alcohol/week: 0.0 standard drinks  . Drug use: No     Family Hx: The patient's family history includes Hyperlipidemia in his father; Stroke in his maternal grandmother. There is no history of Heart attack.  ROS:   Please see the history of present illness.     All other systems reviewed and are negative.   Prior CV studies:    The following studies were reviewed today:    Labs/Other Tests and Data Reviewed:    EKG:  No ECG reviewed.  Recent Labs: 11/24/2018: ALT 24; B Natriuretic Peptide 15.4; BUN 9; Creatinine, Ser 1.02; Hemoglobin 14.6; Platelets 217; Potassium 3.4; Sodium 139   Recent Lipid Panel Lab Results  Component Value Date/Time   CHOL 197 12/01/2015 08:56 AM   TRIG 111 12/01/2015 08:56 AM   HDL 46 12/01/2015 08:56 AM   CHOLHDL 4.3 12/01/2015 08:56 AM   LDLCALC 129 12/01/2015 08:56 AM   LDLDIRECT 176.4 02/19/2013 11:43 AM    Wt Readings from Last 3 Encounters:  03/01/19 208 lb (94.3 kg)  11/24/18 204 lb (92.5 kg)  07/24/18 215 lb 6.4 oz (97.7 kg)     Objective:    Vital Signs:  BP 132/86 (BP Location: Right Arm, Patient Position: Sitting, Cuff Size: Normal)   Pulse 74   Ht 5\' 8"  (1.727 m)   Wt 208 lb (94.3 kg)   BMI 31.63 kg/m    VITAL SIGNS:  reviewed GEN:  no acute distress EYES:  sclerae anicteric, EOMI - Extraocular Movements Intact RESPIRATORY:  normal respiratory effort, symmetric expansion CARDIOVASCULAR:  no peripheral edema SKIN:  no rash, lesions or ulcers. MUSCULOSKELETAL:  no obvious deformities. NEURO:  alert and oriented x 3, no obvious focal deficit PSYCH:  normal affect  ASSESSMENT & PLAN:    1. Paroxysmal atrial fibrillation:.  he is doing well.  Is not had any recent episodes of atrial fibrillation.  He is not had any palpitations.  Continue diltiazem and carvedilol.  2.  Hypertension: His blood pressure remains fairly well controlled.  He has had problems with hypokalemia.  We will try substituting spironolactone for his hydrochlorothiazide.  Discontinue the potassium chloride.  We will start spironolactone 25 mg a day check a basic metabolic profile in 2 to 3 weeks.     COVID-19 Education: The signs and symptoms of COVID-19 were discussed with the patient and how to seek care for testing (follow up with PCP or arrange E-visit).  The importance of social  distancing was discussed today.  Time:   Today, I have spent  19  minutes with the patient with telehealth technology discussing the above problems.     Medication Adjustments/Labs and Tests Ordered: Current medicines are reviewed at length with the patient today.  Concerns regarding medicines are outlined above.   Tests Ordered: Orders Placed This Encounter  Procedures  . Basic Metabolic Panel (BMET)  . Lipid Profile    Medication Changes: Meds ordered this encounter  Medications  . spironolactone (ALDACTONE) 25 MG tablet    Sig: Take 1 tablet (25 mg total) by mouth daily.    Dispense:  30 tablet  Refill:  11    Follow Up:  In Person in 6 month(s)  Signed, Kristeen MissPhilip , MD  03/01/2019 10:19 AM    [facility]

## 2019-03-01 ENCOUNTER — Encounter: Payer: Self-pay | Admitting: Cardiovascular Disease

## 2019-03-01 ENCOUNTER — Other Ambulatory Visit: Payer: Self-pay

## 2019-03-01 ENCOUNTER — Telehealth (INDEPENDENT_AMBULATORY_CARE_PROVIDER_SITE_OTHER): Payer: 59 | Admitting: Cardiovascular Disease

## 2019-03-01 VITALS — BP 132/86 | HR 74 | Ht 68.0 in | Wt 208.0 lb

## 2019-03-01 DIAGNOSIS — I10 Essential (primary) hypertension: Secondary | ICD-10-CM | POA: Diagnosis not present

## 2019-03-01 DIAGNOSIS — I480 Paroxysmal atrial fibrillation: Secondary | ICD-10-CM | POA: Diagnosis not present

## 2019-03-01 DIAGNOSIS — E782 Mixed hyperlipidemia: Secondary | ICD-10-CM

## 2019-03-01 MED ORDER — SPIRONOLACTONE 25 MG PO TABS: 25.0000 mg | ORAL_TABLET | Freq: Every day | ORAL | 11 refills | Status: DC

## 2019-03-01 NOTE — Patient Instructions (Addendum)
Medication Instructions:  Your physician has recommended you make the following change in your medication:  STOP HCTZ (Hydrochlorothiazide) STOP Kdur (Potassium chloride) START Spironolactone (Aldactone) 25 mg once daily in the morning  If you need a refill on your cardiac medications before your next appointment, please call your pharmacy.   Lab work: Your physician recommends that you return for lab work on Friday September 4 You may come to our office for lab work anytime between 7:30 am and 4:45 pm You will need to FAST for this appointment - nothing to eat or drink after midnight the night before except water.  **Please call to reschedule if this appointment date does not work for you. Your lab work is needed between Sept. 4 and Sept. 11   Testing/Procedures: None Ordered   Follow-Up: At [facility], you and your health needs are our priority.  As part of our continuing mission to provide you with exceptional heart care, we have created designated Provider Care Teams.  These Care Teams include your primary Cardiologist (physician) and Advanced Practice Providers (APPs -  Physician Assistants and Nurse Practitioners) who all work together to provide you with the care you need, when you need it. You will need a follow up appointment in:  1 years.  Please call our office 2 months in advance to schedule this appointment.  You may see Mertie Moores, MD or one of the following Advanced Practice Providers on your designated Care Team: Richardson Dopp, PA-C [place], [place] . Daune Perch, NP

## 2019-03-15 ENCOUNTER — Other Ambulatory Visit: Payer: 59

## 2019-03-21 ENCOUNTER — Other Ambulatory Visit: Payer: 59

## 2019-03-25 ENCOUNTER — Other Ambulatory Visit: Payer: 59

## 2019-03-26 ENCOUNTER — Other Ambulatory Visit: Payer: 59

## 2019-03-26 ENCOUNTER — Other Ambulatory Visit: Payer: Self-pay

## 2019-03-26 DIAGNOSIS — I480 Paroxysmal atrial fibrillation: Secondary | ICD-10-CM

## 2019-03-26 DIAGNOSIS — E782 Mixed hyperlipidemia: Secondary | ICD-10-CM

## 2019-03-26 DIAGNOSIS — I10 Essential (primary) hypertension: Secondary | ICD-10-CM

## 2019-03-26 LAB — BASIC METABOLIC PANEL
BUN/Creatinine Ratio: 19 (ref 9–20)
BUN: 17 mg/dL (ref 6–24)
CO2: 22 mmol/L (ref 20–29)
Calcium: 9.5 mg/dL (ref 8.7–10.2)
Chloride: 103 mmol/L (ref 96–106)
Creatinine, Ser: 0.91 mg/dL (ref 0.76–1.27)
GFR calc Af Amer: 114 mL/min/{1.73_m2} (ref >59)
GFR calc non Af Amer: 99 mL/min/{1.73_m2} (ref >59)
Glucose: 98 mg/dL (ref 65–99)
Potassium: 4.2 mmol/L (ref 3.5–5.2)
Sodium: 137 mmol/L (ref 134–144)

## 2019-03-26 LAB — LIPID PANEL
Chol/HDL Ratio: 3.3 ratio (ref 0.0–5.0)
Cholesterol, Total: 141 mg/dL (ref 100–199)
HDL: 43 mg/dL (ref >39)
LDL Chol Calc (NIH): 83 mg/dL (ref 0–99)
Triglycerides: 73 mg/dL (ref 0–149)
VLDL Cholesterol Cal: 15 mg/dL (ref 5–40)

## 2019-07-03 ENCOUNTER — Telehealth: Payer: Self-pay | Admitting: Cardiovascular Disease

## 2019-07-03 DIAGNOSIS — R0602 Shortness of breath: Secondary | ICD-10-CM

## 2019-07-03 NOTE — Telephone Encounter (Signed)
New Message  Pt c/o Shortness Of Breath: STAT if SOB developed within the last 24 hours or pt is noticeably SOB on the phone  1. Are you currently SOB (can you hear that pt is SOB on the phone)? No  2. How long have you been experiencing SOB? Around 2 weeks  3. Are you SOB when sitting or when up moving around? When moving around  4. Are you currently experiencing any other symptoms? No

## 2019-07-03 NOTE — Telephone Encounter (Signed)
Would start off with echo   See if can get quickly

## 2019-07-03 NOTE — Telephone Encounter (Signed)
Left message for patient to call back  

## 2019-07-03 NOTE — Telephone Encounter (Signed)
Spoke with patient who states he has "short-windedness" for a couple of months intermittently. States he saw his GI doctor to evaluate symptoms because he was also concerned about constipation, no lab work was done. States he has fullness/heaviness in abdomen that is somewhat relieved by having a bowel movement but does not completely resolve. States he is calling because he gives out of breath just walking on flat ground, also notices SOB with exertion. Denies swelling in extremities; states abdomen feels tight but he thought it could be related to constipation Denies discomfort in any chest/shoulders/neck/back/arm No history of anemia I advised we have not done any cardiac testing since 2013 and that I will forward message to Dr. Acie Fredrickson for advice. I advised someone from our office will call him to schedule any tests that are ordered or to review Dr. Elmarie Shiley advice with him. He is aware that we are both on limited schedules due to the holidays.  He thanked me for the call.

## 2019-07-04 NOTE — Telephone Encounter (Signed)
Agree with getting an echo. I had a telemedicine visit with him earlier this year.   He needs to be seen in the office by me or an APP in the next several weeks. He has had HTN in the past.   He needs to avoid salt .

## 2019-07-04 NOTE — Telephone Encounter (Signed)
Called and made patient aware of Dr. Elmarie Shiley recommendations for echo, limiting salt, and f/u appt in the office in a few weeks. Patient states that he is feeling well today. Echo scheduled for 12/31 and f/u appt with Pecolia Ades, NP on 07/18/19. Patient will let us know if his Sx change or worsen before then.

## 2019-07-09 ENCOUNTER — Encounter: Payer: Self-pay | Admitting: *Deleted

## 2019-07-11 ENCOUNTER — Other Ambulatory Visit: Payer: Self-pay

## 2019-07-11 ENCOUNTER — Telehealth: Payer: Self-pay | Admitting: Cardiovascular Disease

## 2019-07-11 ENCOUNTER — Ambulatory Visit (HOSPITAL_COMMUNITY): Payer: 59 | Attending: Cardiovascular Disease

## 2019-07-11 DIAGNOSIS — R0602 Shortness of breath: Secondary | ICD-10-CM

## 2019-07-11 NOTE — Telephone Encounter (Signed)
I spoke to the patient who called because he is trying to read his Echo results on MyChart, but is unable.  I told him that Dr Acie Fredrickson has not yet gave his interpretation.  We will call him when available.  He verbalized understanding.

## 2019-07-11 NOTE — Telephone Encounter (Signed)
New Message  Pt called and stated that he received his echo results on mychart but he is unable to view them. Would like someone to go over results for him  Please call

## 2019-07-17 ENCOUNTER — Telehealth: Payer: Self-pay | Admitting: Cardiovascular Disease

## 2019-07-17 NOTE — Telephone Encounter (Signed)
Patient states that he is not certain he still needs to come in for his appointment on 07/25/19. Please advise.

## 2019-07-18 ENCOUNTER — Telehealth: Payer: Self-pay | Admitting: Cardiovascular Disease

## 2019-07-18 ENCOUNTER — Other Ambulatory Visit: Payer: Self-pay | Admitting: Physician Assistant

## 2019-07-18 ENCOUNTER — Ambulatory Visit: Payer: 59 | Admitting: Cardiology

## 2019-07-18 MED ORDER — CARVEDILOL 12.5 MG PO TABS: 18.7500 mg | ORAL_TABLET | Freq: Two times a day (BID) | ORAL | 2 refills | Status: DC

## 2019-07-18 NOTE — Telephone Encounter (Signed)
Follow Up:    Pt is calling you back from your message today.

## 2019-07-18 NOTE — Telephone Encounter (Signed)
Spoke with patient who called to let us know he is feeling well and is going to work on better diet and increasing frequency of exercise. He will call if he has concerns prior to planned follow-up in the summer. He thanked me for the call.

## 2019-07-18 NOTE — Telephone Encounter (Signed)
Left detailed message for patient that he can call back to cancel appointment if he feels that his concerns about his SOB have been resolved or if he does not feel like the symptoms are related to his heart. I advised him to call back with questions or to cancel or reschedule appointment.

## 2019-07-25 ENCOUNTER — Ambulatory Visit: Payer: 59 | Admitting: Cardiology

## 2019-08-20 ENCOUNTER — Telehealth: Payer: Self-pay | Admitting: Cardiovascular Disease

## 2019-08-20 NOTE — Telephone Encounter (Signed)
Patient is scheduled to see Lizabeth Leyden tomorrow, but wanted to know if he needed to be seen sooner. He is not having active chest pain or any other symptoms

## 2019-08-20 NOTE — Telephone Encounter (Signed)
Patient reports a sensation that feels like a needle is poking around his heart. He sates the feeling comes and goes, and resolves itself on its own. The feeling is not triggered by any specific activity, it can occur at any time of the day. He j

## 2019-08-20 NOTE — Telephone Encounter (Signed)
Called patient who states he is having "sharp needle points" in his chest at the area of his heart. He states it is aggravating but denies fluttering, palpitations, and does not think that it is a fib. States he feels it at various times in the day, onset 2-3 weeks ago. He denies SOB, chest pressure, or other concerns consistent with angina.  Denies recent changes in medical hx or new medications.  States he is compliant with his cardiac medications. He has hx hypokalemia, states he eats K+ rich foods daily. He states he also has some questions about a fib from an online group that he is a part of. I answered some of his questions and advised him to bring additional questions to his appointment. He verbalized understanding and agreement and thanked me for the call.

## 2019-08-21 ENCOUNTER — Other Ambulatory Visit: Payer: Self-pay

## 2019-08-21 ENCOUNTER — Encounter: Payer: Self-pay | Admitting: Cardiology

## 2019-08-21 ENCOUNTER — Ambulatory Visit (INDEPENDENT_AMBULATORY_CARE_PROVIDER_SITE_OTHER): Payer: 59 | Admitting: Family Medicine

## 2019-08-21 VITALS — BP 138/96 | HR 65 | Ht 68.0 in | Wt 222.0 lb

## 2019-08-21 DIAGNOSIS — I10 Essential (primary) hypertension: Secondary | ICD-10-CM | POA: Diagnosis not present

## 2019-08-21 DIAGNOSIS — I480 Paroxysmal atrial fibrillation: Secondary | ICD-10-CM | POA: Diagnosis not present

## 2019-08-21 DIAGNOSIS — R0602 Shortness of breath: Secondary | ICD-10-CM | POA: Diagnosis not present

## 2019-08-21 DIAGNOSIS — R002 Palpitations: Secondary | ICD-10-CM | POA: Diagnosis not present

## 2019-08-21 NOTE — Patient Instructions (Signed)
Medication Instructions:  Your physician recommends that you continue on your current medications as directed. Please refer to the Current Medication list given to you today.  *If you need a refill on your cardiac medications before your next appointment, please call your pharmacy*  Lab Work: None ordered  If you have labs (blood work) drawn today and your tests are completely normal, you will receive your results only by: Marland Kitchen MyChart Message (if you have MyChart) OR . A paper copy in the mail If you have any lab test that is abnormal or we need to change your treatment, we will call you to review the results.  Testing/Procedures: None ordered  Follow-Up: At [facility], you and your health needs are our priority.  As part of our continuing mission to provide you with exceptional heart care, we have created designated Provider Care Teams.  These Care Teams include your primary Cardiologist (physician) and Advanced Practice Providers (APPs -  Physician Assistants and Nurse Practitioners) who all work together to provide you with the care you need, when you need it.  Your next appointment:   6 month(s)  The format for your next appointment:   In Person  Provider:   You may see Kristeen Miss, MD or one of the following Advanced Practice Providers on your designated Care Team:    Tereso Newcomer, PA-C  [place]in [place], [place]  Berton Bon, NP   Other Instructions None

## 2019-08-21 NOTE — Progress Notes (Signed)
Cardiology Office Note  Date: 08/21/2019   ID: Jon Collins, DOB 09-08-1970, MRN [id number]  PCP:  [facility]  Cardiologist:  Mertie Moores, MD Electrophysiologist:  None   Chief Complaint  Patient presents with  . Follow-up    Palpitations    History of Present Illness: Jon Collins is a 49 y.o. male with history of paroxysmal atrial fibrillation and hypertension, obstructive sleep apnea.  Last visit with Dr. Acie Fredrickson on March 01, 2019 patient was complaining of palpitations.  He had previously been to the emergency room twice earlier in the month.  He was placed on Coreg along with diltiazem.  Now taking Coreg 18.75 mg p.o. twice daily.  Potassium was low during that visit and K.Dur was increased. He was instructed to stop HCTZ and K-Dur.  He was instructed to start spironolactone 25 mg once daily.  He had an event monitor placed 11/2018 showing there were 0 critical, 0 serious, and 9 stable events that occurred.   There was a telephone call with nursing staff on 07/03/2019 with patient complaining of "short windedness" for couple of months intermittently.  He had seen his GI doctor to evaluate symptoms because he was concerned about constipation.  He complained of fullness/heaviness in abdomen somewhat relieved by bowel movement but does not completely resolve.  He complained of giving out of breath just walking on flat ground.  Also complained of shortness of breath with exertion.  An echocardiogram was ordered.   Yesterday a call was made to the patient who stated he was having "sharp needle points" in chest area.  He denied any shortness of breath or chest pressure.  He also had a question about atrial fibrillation.   Past Medical History:  Diagnosis Date  . Anxiety   . 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 Diverticulitis of large intestine without perforation or abscess with bleeding 09/30/2014  . HTN (hypertension)   . Hyperlipidemia 04/14/2015  .  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 Dx 04/2012 in ER. Event monitor 07/2012 for palpitations - pt triggers correlated with NSR only.  . MVP (mitral valve prolapse)    a. Pt reported dx at age 42, NOT seen on echo. 2D echo 05/2012: EF 55-60%, no RWMA, grade 2 diastolic dysfunction, no significant valvular abnormalities.  . Reduced libido 11/09/2017  . Sleep apnea     Past Surgical History:  Procedure Laterality Date  . NO PAST SURGERIES      Current Outpatient Medications  Medication Sig Dispense Refill  . aspirin EC 81 MG tablet Take 81 mg by mouth daily.    Marland Kitchen atorvastatin (LIPITOR) 20 MG tablet TAKE ONE TABLET BY MOUTH ONCE DAILY 90 tablet 1  . carvedilol (COREG) 12.5 MG tablet Take 1.5 tablets (18.75 mg total) by mouth 2 (two) times daily with a meal. 270 tablet 2  . diltiazem (CARTIA XT) 180 MG 24 hr capsule Take 180 mg by mouth daily.    Marland Kitchen spironolactone (ALDACTONE) 25 MG tablet Take 1 tablet (25 mg total) by mouth daily. 30 tablet 11  . tadalafil (CIALIS) 5 MG tablet Take 1 tablet by mouth as needed.    . testosterone cypionate (DEPO-TESTOSTERONE) 100 MG/ML injection      No current facility-administered medications for this visit.   Allergies:  Amoxicillin   Social History: The patient  reports that he has never smoked. He has never used smokeless tobacco. He reports that he does not drink alcohol or use drugs.  Family History: The patient's family history includes Hyperlipidemia in his father; Stroke in his maternal grandmother.   ROS:  Please see the history of present illness. Otherwise, complete review of systems is positive for none.  All other systems are reviewed and negative.   Physical Exam: VS:  There were no vitals taken for this visit., BMI There is no height or weight on file to calculate BMI.  Wt Readings from Last 3 Encounters:  03/01/19 208 lb (94.3 kg)  11/24/18 204 lb (92.5 kg)  07/24/18 215 lb 6.4 oz (97.7 kg)    General: Patient appears comfortable at  rest. HEENT: Conjunctiva and lids normal, oropharynx clear with moist mucosa. Neck: Supple, no elevated JVP or carotid bruits, no thyromegaly. Lungs: Clear to auscultation, nonlabored breathing at rest. Cardiac: Regular rate and rhythm, no S3 or significant systolic murmur, no pericardial rub. Abdomen: Soft, nontender, no hepatomegaly, bowel sounds present, no guarding or rebound. Extremities: No pitting edema, distal pulses 2+. Skin: Warm and dry. Musculoskeletal: No kyphosis. Neuropsychiatric: Alert and oriented x3, affect grossly appropriate.  ECG:  An ECG dated 08/21/2019 was personally reviewed today and demonstrated:  Sinus rhythm rate of 65 left ventricular hypertrophy, anterior ST elevation probably due to LVH.  No significant changes since last EKG  Recent Labwork: 11/24/2018: ALT 24; AST 19; B Natriuretic Peptide 15.4; Hemoglobin 14.6; Platelets 217 03/26/2019: BUN 17; Creatinine, Ser 0.91; Potassium 4.2; Sodium 137     Component Value Date/Time   CHOL 141 03/26/2019 0740   TRIG 73 03/26/2019 0740   HDL 43 03/26/2019 0740   CHOLHDL 3.3 03/26/2019 0740   CHOLHDL 4.3 12/01/2015 0856   VLDL 22 12/01/2015 0856   LDLCALC 83 03/26/2019 0740   LDLDIRECT 176.4 02/19/2013 1143    Other Studies Reviewed Today:  Cardiac event monitor 11/20/2018 Summary: The patient's monitoring period was 10/23/2018 - 11/21/2018. Baseline sample showed Sinus Rhythm with a heart rate of 80.2 bpm. There were 0 critical, 0 serious, and 9 stable events that occurred. The report analysis of the critical, serious, stable and manually triggered events are listed below. Manually Detected Events: 3 Stable: Sinus Rhythm w/Artifact . None or Accidental Push 3 Stable: Sinus Rhythm . None or Accidental Push 1 Stable: Sinus Rhythm w/Artifact . Flutter or Skipped Beats 1 Stable: Sinus Rhythm w/Lead Loss . None or Accidental Push  Echocardiogram 07/11/2019 1. Left ventricular ejection fraction, by visual  estimation, is 60 to 65%. The left ventricle has normal function. Left ventricular septal wall thickness was moderately increased. Mildly increased left ventricular posterior wall thickness. There is moderately increased left ventricular hypertrophy. 2. Left ventricular diastolic parameters are indeterminate. 3. The left ventricle has no regional wall motion abnormalities. 4. Global right ventricle has normal systolic function.The right ventricular size is normal. No increase in right ventricular wall thickness. 5. Left atrial size was normal. 6. Right atrial size was normal. 7. The mitral valve is normal in structure. No evidence of mitral valve regurgitation. No evidence of mitral stenosis. 8. The tricuspid valve is normal in structure. 9. The aortic valve is tricuspid. Aortic valve regurgitation is not visualized. No evidence of aortic valve sclerosis or stenosis. 10. The pulmonic valve was normal in structure. Pulmonic valve regurgitation is trivial. 11. Normal pulmonary artery systolic pressure. 12. The inferior vena cava is normal in size with greater than 50% respiratory variability, suggesting right atrial pressure of 3 mmHg.  Left Ventricle: Left ventricular ejection fraction, by visual estimation, is 60 to 65%. The left  ventricle hasnormal function. The left ventricle has no regional wall motion abnormalities. Mildly increased left ventricular posterior wall thickness. There is moderately increased left ventricular hypertrophy. Left ventricular diastolic parameters are indeterminate. Indeterminate filling pressures. Right Ventricle: The right ventricular size is normal. No increase in right ventricular wall thickness.Global RV systolic function is has normal systolic function. The tricuspid regurgitant velocity is 2.14 m/s,and with an assumed right atrial pressure of 3 mmHg, the estimated right ventricular systolic pressure isnormal at 26.8 mmHg. Left Atrium: Left atrial size was  normal in size. Right Atrium: Right atrial size was normal in size Pericardium: There is no evidence of pericardial effusion. Mitral Valve: The mitral valve is normal in structure. No evidence of mitral valve regurgitation. No evidence of mitral valve stenosis by observation. Tricuspid Valve: The tricuspid valve is normal in structure. Tricuspid valve regurgitation is trivial. Aortic Valve: The aortic valve is tricuspid. Aortic valve regurgitation is not visualized. The aortic valve isstructurally normal, with no evidence of sclerosis or stenosis. Pulmonic Valve: The pulmonic valve was normal in structure. Pulmonic valve regurgitation is trivial.Pulmonic regurgitation is trivial. Aorta: The aortic root, ascending aorta and aortic arch are all structurally normal, with no evidence ofdilitation or obstruction. Venous: The inferior vena cava is normal in size with greater than 50% respiratory variability, suggestingright atrial pressure of 3 mmHg. IAS/Shunts: No atrial level shunt detected by color flow Doppler. There is no evidence of a patent foramen ovale. No ventricular septal defect is seen or detected. There is no evidence of an atrial septal defect.  Assessment and Plan:  1. Palpitations   2. Paroxysmal atrial fibrillation (HCC)   3. SOB (shortness of breath)   4. Essential hypertension    1. Paroxysmal atrial fibrillation [facility]) Patient denies any recent palpitations since increasing dosage of carvedilol.  Patient denies any stimulant intake such as caffeine but does drink brandy a few times a week which could be contributing to palpitations.  Event monitor showed no episodes of atrial fibrillation.  See report above.  Patient does have some anxiety issues which may contribute.  Patient does have obstructive sleep apnea and is not always compliant with therapy.  Discussed the fact that obstructive sleep apnea can contribute to palpitations and arrhythmias also.  Advised patient to continue  his carvedilol 18.75 mg p.o. twice daily and not to skip doses.  Continue taking diltiazem extended release 180 mg daily.  2. SOB (shortness of breath) Patient denies any recent significant shortness of breath.  Patient admits he is overweight and is deconditioned due to lack of activity.  Advised patient to increase activity with exercise and make dietary changes for weight loss.  3. Essential hypertension Blood pressure today 138/96.  Continue carvedilol and diltiazem.  4. Palpitations Patient states palpitations have significantly decreased since increasing dosage of carvedilol.  States he occasionally feels pinpoint pains in left precordial chest area but no associated symptoms.  Denies any syncope or near syncopal episodes, dyspnea.  Medication Adjustments/Labs and Tests Ordered: Current medicines are reviewed at length with the patient today.  Concerns regarding medicines are outlined above.    There are no Patient Instructions on file for this visit.       Signed, Rennis Harding, NP 08/21/2019 3:18 PM    [facility]

## 2019-10-13 ENCOUNTER — Other Ambulatory Visit: Payer: Self-pay | Admitting: Physician Assistant

## 2020-02-04 ENCOUNTER — Encounter (HOSPITAL_COMMUNITY): Payer: Self-pay | Admitting: Emergency Medicine

## 2020-02-04 ENCOUNTER — Emergency Department (HOSPITAL_COMMUNITY)
Admission: EM | Admit: 2020-02-04 | Discharge: 2020-02-04 | Disposition: A | Discharge status: Home / Self Care | Payer: 59 | Attending: Emergency Medicine | Admitting: Emergency Medicine

## 2020-02-04 ENCOUNTER — Other Ambulatory Visit: Payer: Self-pay

## 2020-02-04 DIAGNOSIS — K1379 Other lesions of oral mucosa: Secondary | ICD-10-CM | POA: Diagnosis present

## 2020-02-04 DIAGNOSIS — Z5321 Procedure and treatment not carried out due to patient leaving prior to being seen by health care provider: Secondary | ICD-10-CM | POA: Insufficient documentation

## 2020-02-04 NOTE — ED Triage Notes (Signed)
Pt states he went to sleep tonight without his cpap, woke up and felt like his uvula had dropped and was touching his tongue. Reports symptoms have improved.

## 2020-02-04 NOTE — ED Notes (Signed)
Pt stated he was leaving, would try urgent care in the morning.

## 2020-02-24 ENCOUNTER — Other Ambulatory Visit: Payer: Self-pay | Admitting: Cardiovascular Disease

## 2020-03-03 ENCOUNTER — Telehealth: Payer: Self-pay | Admitting: Cardiovascular Disease

## 2020-03-03 DIAGNOSIS — I10 Essential (primary) hypertension: Secondary | ICD-10-CM

## 2020-03-03 DIAGNOSIS — E782 Mixed hyperlipidemia: Secondary | ICD-10-CM

## 2020-03-03 DIAGNOSIS — R002 Palpitations: Secondary | ICD-10-CM

## 2020-03-03 DIAGNOSIS — I480 Paroxysmal atrial fibrillation: Secondary | ICD-10-CM

## 2020-03-03 DIAGNOSIS — E876 Hypokalemia: Secondary | ICD-10-CM

## 2020-03-03 NOTE — Telephone Encounter (Signed)
Spoke with patient who states he would like to have lab work done prior to his appointment with Dr. Elease Hashimoto on 9/13. He reports no recent lab work since October 20. I advised that orders have been placed and patient is scheduled for lab appointment on 9/10. He thanked me for my help.

## 2020-03-03 NOTE — Telephone Encounter (Signed)
Patient is calling to see if Dr. Elease Hashimoto can order labs, to check everything regarding his heart, prior to his upcoming appointment with him on 9/13 at 4:20pm. Please advise.

## 2020-03-10 ENCOUNTER — Other Ambulatory Visit: Payer: 59 | Admitting: *Deleted

## 2020-03-10 ENCOUNTER — Other Ambulatory Visit: Payer: Self-pay

## 2020-03-10 LAB — BASIC METABOLIC PANEL
BUN/Creatinine Ratio: 19 (ref 9–20)
BUN: 18 mg/dL (ref 6–24)
CO2: 23 mmol/L (ref 20–29)
Calcium: 9.4 mg/dL (ref 8.7–10.2)
Chloride: 103 mmol/L (ref 96–106)
Creatinine, Ser: 0.97 mg/dL (ref 0.76–1.27)
GFR calc Af Amer: 105 mL/min/{1.73_m2} (ref >59)
GFR calc non Af Amer: 91 mL/min/{1.73_m2} (ref >59)
Glucose: 101 mg/dL — ABNORMAL HIGH (ref 65–99)
Potassium: 4 mmol/L (ref 3.5–5.2)
Sodium: 139 mmol/L (ref 134–144)

## 2020-03-10 LAB — LIPID PANEL
Chol/HDL Ratio: 3.6 ratio (ref 0.0–5.0)
Cholesterol, Total: 147 mg/dL (ref 100–199)
HDL: 41 mg/dL (ref >39)
LDL Chol Calc (NIH): 86 mg/dL (ref 0–99)
Triglycerides: 110 mg/dL (ref 0–149)
VLDL Cholesterol Cal: 20 mg/dL (ref 5–40)

## 2020-03-10 LAB — CBC
Hematocrit: 41.8 % (ref 37.5–51.0)
Hemoglobin: 14.4 g/dL (ref 13.0–17.7)
MCH: 30.2 pg (ref 26.6–33.0)
MCHC: 34.4 g/dL (ref 31.5–35.7)
MCV: 88 fL (ref 79–97)
Platelets: 225 10*3/uL (ref 150–450)
RBC: 4.77 x10E6/uL (ref 4.14–5.80)
RDW: 12.7 % (ref 11.6–15.4)
WBC: 3.9 10*3/uL (ref 3.4–10.8)

## 2020-03-10 LAB — HEPATIC FUNCTION PANEL
ALT: 24 IU/L (ref 0–44)
AST: 19 IU/L (ref 0–40)
Albumin: 4.3 g/dL (ref 4.0–5.0)
Alkaline Phosphatase: 50 IU/L (ref 48–121)
Bilirubin Total: 0.6 mg/dL (ref 0.0–1.2)
Bilirubin, Direct: 0.17 mg/dL (ref 0.00–0.40)
Total Protein: 7 g/dL (ref 6.0–8.5)

## 2020-03-10 LAB — TSH: TSH: 0.75 u[IU]/mL (ref 0.450–4.500)

## 2020-03-11 ENCOUNTER — Encounter: Payer: Self-pay | Admitting: Cardiovascular Disease

## 2020-03-11 NOTE — Progress Notes (Signed)
Date:  03/12/2020   ID:  Jon Collins, DOB 1970/10/03, MRN [id number]  Patient Location: Home Provider Location: Home  PCP:  System, Pcp Not In  Cardiologist:  Kristeen Miss, MD  Electrophysiologist:  None   Problem List 1. Paroxysmal atrial fibrillation 2.  Essential HTN    October 25, 2018:  Jon Collins is a 49 y.o. male with a history of paroxysmal atrial fibrillation and hypertension.  He also has a history of obstructive sleep apnea.  He is not on anticoagulation.  He presents today for further evaluation of increasing palpitations.  He has been to the emergency room twice this month already.  He presented on April 5 for further evaluation of palpitations.  He returned on April 12 with abdominal pain and symptoms consistent with ileus.  He has had more palpitations. Woke up with more palpitatons.   lke a puase  May last several minutes.   Was placed on Coreg along with Dilt He was previously taking coreg only 1 a day  Now is on 18.75 mg PO BID  Potassium was found to be low - kdur was increased  Has a event monitor  No cough, fever, no aches    Evaluation Performed:  Follow-Up Visit  Chief Complaint:  palpitations  Aug. 20, 2020 - vrtual visit    [name] is a 49 y.o. male with PAF .  No recent episodes of palpitations.  No cp or dyspnea.  Was in the ER Nov 24, 2018 for abdominal pain  Work up was negative     .Sept 2, 2021 Jon Collins is seen today for follow up visit for his PAF and HTN  Jon Collins is seen today for follow-up visit. Has gained some weight . Working  on that Has had Both covid vaccines.    Past Medical History:  Diagnosis Date  . Anxiety   . 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 Diverticulitis of large intestine without perforation or abscess with bleeding 09/30/2014  . HTN (hypertension)   . Hyperlipidemia 04/14/2015  . Lone atrial fibrillation (HCC)    a. Dx 04/2012 in ER. Event monitor 07/2012 for palpitations  - pt triggers correlated with NSR only.  . MVP (mitral valve prolapse)    a. Pt reported dx at age 78, NOT seen on echo. 2D echo 05/2012: EF 55-60%, no RWMA, grade 2 diastolic dysfunction, no significant valvular abnormalities.  . Reduced libido 11/09/2017  . Sleep apnea    Past Surgical History:  Procedure Laterality Date  . NO PAST SURGERIES       Current Meds  Medication Sig  . aspirin EC 81 MG tablet Take 81 mg by mouth daily.  Marland Kitchen atorvastatin (LIPITOR) 20 MG tablet TAKE ONE TABLET BY MOUTH ONCE DAILY  . carvedilol (COREG) 12.5 MG tablet Take 1.5 tablets (18.75 mg total) by mouth 2 (two) times daily with a meal.  . diltiazem (CARTIA XT) 180 MG 24 hr capsule Take 180 mg by mouth daily.  Marland Kitchen diltiazem (TIAZAC) 180 MG 24 hr capsule Take 1 capsule by mouth daily.  . meloxicam (MOBIC) 15 MG tablet Take 15 mg by mouth as needed.  . sertraline (ZOLOFT) 50 MG tablet Take 50 mg by mouth daily.  . tadalafil (CIALIS) 5 MG tablet Take 1 tablet by mouth as needed.     Allergies:   Amoxicillin   Social History   Tobacco Use  . Smoking status: Never Smoker  . Smokeless tobacco: Never Used  Vaping Use  .  Vaping Use: Never used  Substance Use Topics  . Alcohol use: No    Alcohol/week: 0.0 standard drinks  . Drug use: No     Family Hx: The patient's family history includes Hyperlipidemia in his father; Stroke in his maternal grandmother. There is no history of Heart attack.  ROS:   Please see the history of present illness.     All other systems reviewed and are negative.   Prior CV studies:   The following studies were reviewed today:    Labs/Other Tests and Data Reviewed:    EKG:     Recent Labs: 03/10/2020: ALT 24; BUN 18; Creatinine, Ser 0.97; Hemoglobin 14.4; Platelets 225; Potassium 4.0; Sodium 139; TSH 0.750   Recent Lipid Panel Lab Results  Component Value Date/Time   CHOL 147 03/10/2020 07:30 AM   TRIG 110 03/10/2020 07:30 AM   HDL 41 03/10/2020 07:30 AM    CHOLHDL 3.6 03/10/2020 07:30 AM   CHOLHDL 4.3 12/01/2015 08:56 AM   LDLCALC 86 03/10/2020 07:30 AM   LDLDIRECT 176.4 02/19/2013 11:43 AM    Wt Readings from Last 3 Encounters:  03/12/20 230 lb 12.8 oz (104.7 kg)  08/21/19 222 lb (100.7 kg)  03/01/19 208 lb (94.3 kg)     Objective:     Physical Exam: Blood pressure 134/84, pulse 62, height 5\' 8"  (1.727 m), weight 230 lb 12.8 oz (104.7 kg), SpO2 97 %.  GEN:  Well nourished, well developed in no acute distress HEENT: Normal NECK: No JVD; No carotid bruits LYMPHATICS: No lymphadenopathy CARDIAC: RRR , no murmurs, rubs, gallops RESPIRATORY:  Clear to auscultation without rales, wheezing or rhonchi  ABDOMEN: Soft, non-tender, non-distended MUSCULOSKELETAL:  No edema; No deformity  SKIN: Warm and dry NEUROLOGIC:  Alert and oriented x 3   ASSESSMENT & PLAN:    Paroxysmal atrial fibrillation:.   Doing well .  Cont  Meds. Coreg. cardizem CHADS2VASC is 1  Continue aspirin for now.  2.  Hypertension:   Blood pressure is well controlled.  Continue current medications.  3.  hyperlipidema -lipid levels look great.  Continue atorvastatin at current dose.  I will see him again in 1 year.     Medication Adjustments/Labs and Tests Ordered: Current medicines are reviewed at length with the patient today.  Concerns regarding medicines are outlined above.   Tests Ordered: No orders of the defined types were placed in this encounter.   Medication Changes: No orders of the defined types were placed in this encounter.   Follow Up: in 1 year   Signed, , MD  03/12/2020 2:18 PM    [facility]

## 2020-03-12 ENCOUNTER — Ambulatory Visit (INDEPENDENT_AMBULATORY_CARE_PROVIDER_SITE_OTHER): Payer: 59 | Admitting: Cardiovascular Disease

## 2020-03-12 ENCOUNTER — Other Ambulatory Visit: Payer: Self-pay

## 2020-03-12 ENCOUNTER — Encounter: Payer: Self-pay | Admitting: Cardiovascular Disease

## 2020-03-12 VITALS — BP 134/84 | HR 62 | Ht 68.0 in | Wt 230.8 lb

## 2020-03-12 DIAGNOSIS — I10 Essential (primary) hypertension: Secondary | ICD-10-CM

## 2020-03-12 DIAGNOSIS — I480 Paroxysmal atrial fibrillation: Secondary | ICD-10-CM | POA: Diagnosis not present

## 2020-03-12 NOTE — Patient Instructions (Signed)
Medication Instructions:  No changes *If you need a refill on your cardiac medications before your next appointment, please call your pharmacy*   Lab Work: none If you have labs (blood work) drawn today and your tests are completely normal, you will receive your results only by: MyChart Message (if you have MyChart) OR A paper copy in the mail If you have any lab test that is abnormal or we need to change your treatment, we will call you to review the results.   Testing/Procedures: none   Follow-Up: At [facility], you and your health needs are our priority.  As part of our continuing mission to provide you with exceptional heart care, we have created designated Provider Care Teams.  These Care Teams include your primary Cardiologist (physician) and Advanced Practice Providers (APPs -  Physician Assistants and Nurse Practitioners) who all work together to provide you with the care you need, when you need it.   Your next appointment:   12 month(s)  The format for your next appointment:   In Person  Provider:   You may see Philip Nahser, MD or one of the following Advanced Practice Providers on your designated Care Team:   Scott Weaver, PA-C Vin Bhagat, PA-C   Other Instructions   

## 2020-03-13 ENCOUNTER — Telehealth: Payer: Self-pay | Admitting: Cardiovascular Disease

## 2020-03-13 NOTE — Telephone Encounter (Signed)
1. What dental office are you calling from? Patient is calling about dentist appointment, office is called [facility]  2. What is your office phone number? [phone]  3. What is your fax number?   4. What type of procedure is the patient having performed? 8 Extractions two on the side and the rest on lower back   5. What date is procedure scheduled or is the patient there now? 03/24/20 (if the patient is at the dentist's office question goes to their cardiologist if he/she is in the office.  If not, question should go to the DOD).   6. What is your question (ex. Antibiotics prior to procedure, holding medication-we need to know how long dentist wants pt to hold med)? Antibiotics prior and holding meds

## 2020-03-17 NOTE — Telephone Encounter (Signed)
   Primary Cardiologist: Kristeen Miss, MD  Chart reviewed as part of pre-operative protocol coverage. Given past medical history and time since last visit, based on ACC/AHA guidelines, Joshuwa Vecchio would be at acceptable risk for the planned procedure without further cardiovascular testing.   The patient does not require SBE prophylaxis.  The patient was advised that if he develops new symptoms prior to surgery to contact our office to arrange for a follow-up visit, and he verbalized understanding.  I will route this recommendation to the requesting party via Epic fax function and remove from pre-op pool.  Please call with questions.  Corine Shelter, PA-C 03/17/2020, 10:21 AM

## 2020-03-17 NOTE — Telephone Encounter (Signed)
I need a fax number to send clearance.  Corine Shelter PA-C 03/17/2020 9:51 AM

## 2020-03-17 NOTE — Telephone Encounter (Signed)
The fax # is [phone]

## 2020-03-20 ENCOUNTER — Other Ambulatory Visit: Payer: 59

## 2020-03-23 ENCOUNTER — Ambulatory Visit: Payer: 59 | Admitting: Cardiovascular Disease

## 2020-03-23 ENCOUNTER — Telehealth: Payer: Self-pay | Admitting: Cardiovascular Disease

## 2020-03-23 NOTE — Telephone Encounter (Signed)
Patient called to state that he was started back on hydrochlorothiazide 25 mg by mouth daily by his PCP. Patient wanted to know why he was taken off this to begin with. Informed patient that he was switched to spirolactone, then that was discontinued at his last office visit. Patient stated he will try the hydrochlorothiazide again, because he does not think he had any issues with it. Will forward to Dr. Elease Hashimoto so he is aware.

## 2020-03-23 NOTE — Telephone Encounter (Signed)
Patient would like to speak to a nurse about current and pervious medications.

## 2020-03-27 ENCOUNTER — Telehealth: Payer: Self-pay | Admitting: Cardiovascular Disease

## 2020-03-27 NOTE — Telephone Encounter (Signed)
Spoke with pt who is concerned because he was given a prescription for ibuprofen after dental extraction.  Pt reports his heart rate was elevated this morning at 110 bpm and he feels as though he was in At Fib.  HR has since slowed back to his normal.  He is asking what has caused this.  Advised any stress on his body, excessive wt, stimulants like caffeine, including chocolate and alcohol, pain etc could cause At Fib.  Advised if he feels more comfortable taking tylenol that would be fine but ibuprofen as needed for a few days should not pose a problem.  Pt states understanding and was grateful for the c/b and information.

## 2020-03-27 NOTE — Telephone Encounter (Signed)
Pt called and stated that he had some dental work done and they prescribed him some ibuprofen to take. He wants to know should he take it since he's in afib

## 2020-04-01 IMAGING — DX PORTABLE CHEST - 1 VIEW
1 series · 1 of 1 positions shown · non-contrast
Comparison: Two-view chest x-ray 10/14/2018

CLINICAL DATA: Shortness of breath for 2 weeks.

EXAM:
PORTABLE CHEST 1 VIEW

[chest]
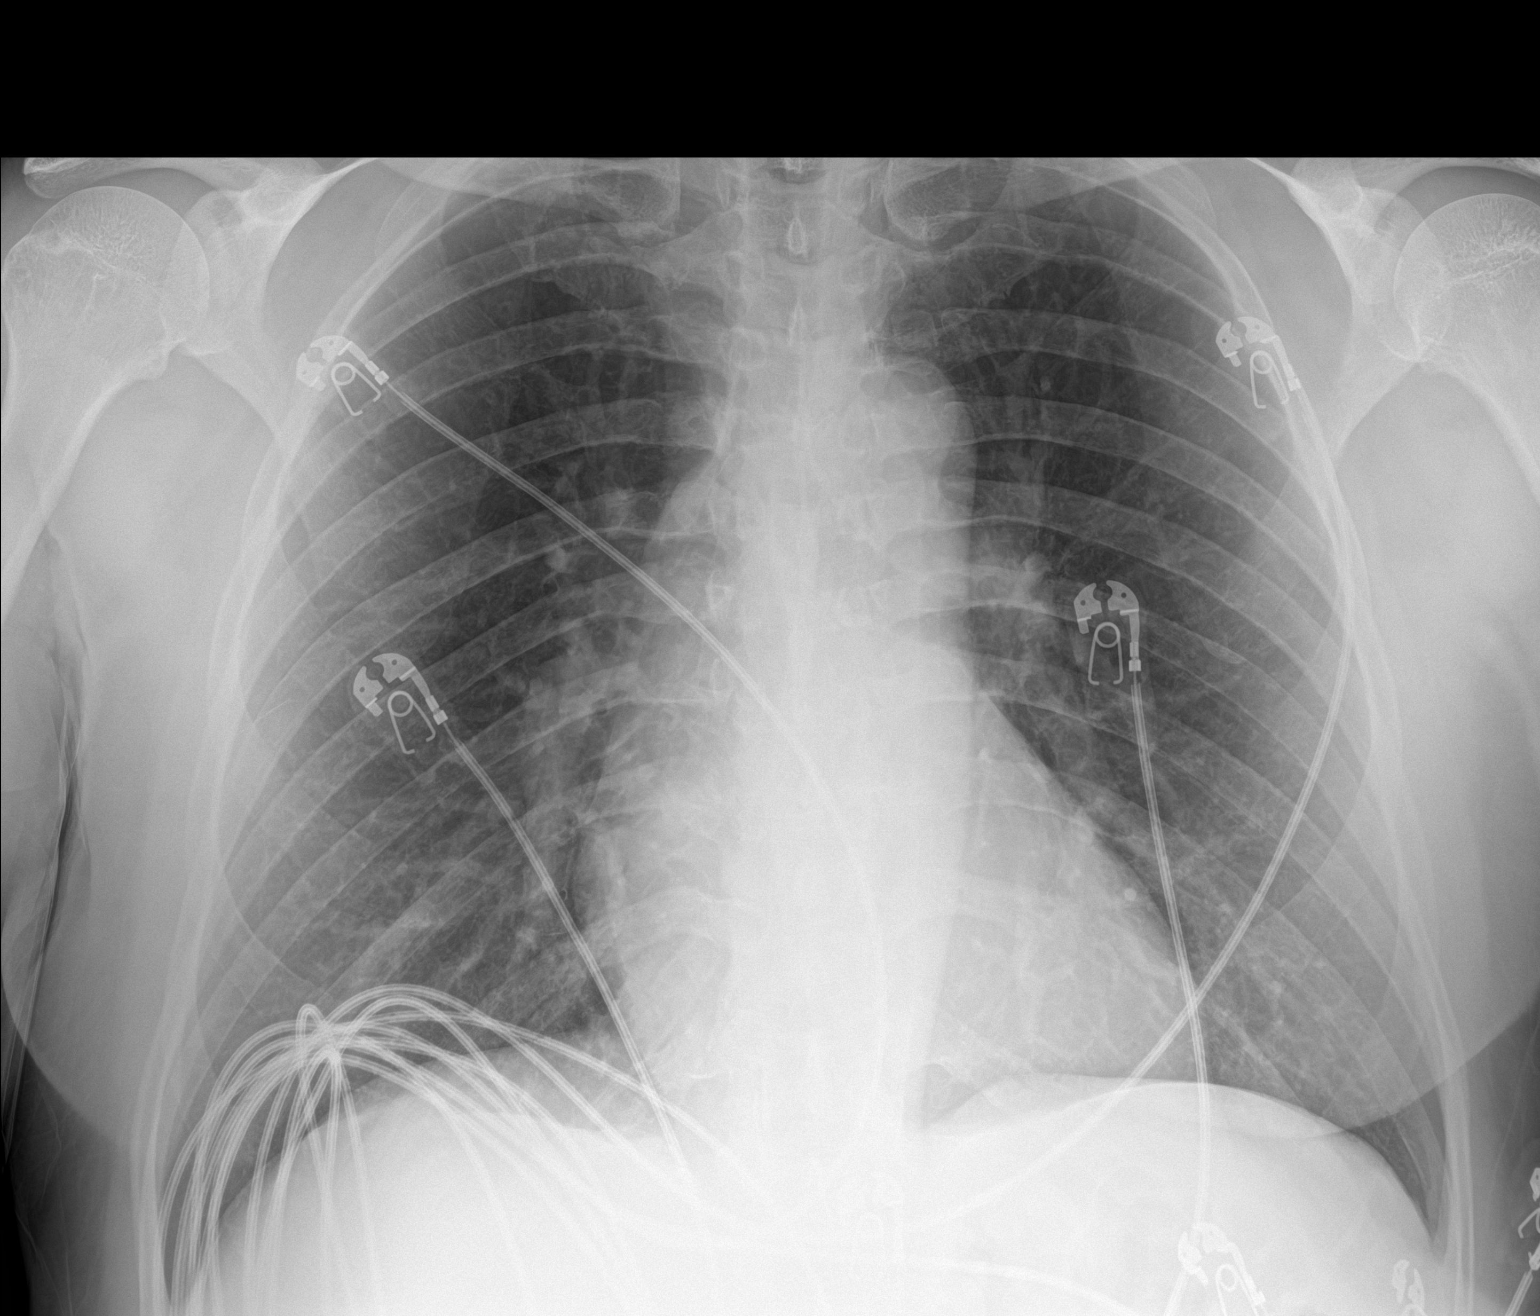

[1 of 1 positions shown; findings below may reference images not displayed]

FINDINGS: Heart size is normal. Lungs are clear. There is no edema or
effusion. No focal airspace disease is present. The visualized soft
tissues and bony thorax are unremarkable.
IMPRESSION: 1. Negative one-view chest x-ray

## 2020-07-19 ENCOUNTER — Other Ambulatory Visit: Payer: Self-pay | Admitting: Physician Assistant

## 2020-07-20 ENCOUNTER — Other Ambulatory Visit: Payer: Self-pay | Admitting: Physician Assistant

## 2021-05-14 ENCOUNTER — Ambulatory Visit: Payer: 59 | Admitting: Cardiovascular Disease

## 2021-05-26 ENCOUNTER — Ambulatory Visit: Payer: 59 | Admitting: Cardiovascular Disease

## 2021-05-30 ENCOUNTER — Encounter: Payer: Self-pay | Admitting: Cardiovascular Disease

## 2021-05-30 NOTE — Progress Notes (Signed)
Date:  05/31/2021   ID:  Jon Collins, DOB 07-02-1971, MRN [id number]  Patient Location: Home Provider Location: Home  PCP:  Erie Noe, MD  Cardiologist:  Mertie Moores, MD  Electrophysiologist:  None   Problem List 1. Paroxysmal atrial fibrillation 2.  Essential HTN    October 25, 2018:  Jon Collins is a 50 y.o. male with a history of paroxysmal atrial fibrillation and hypertension.  He also has a history of obstructive sleep apnea.  He is not on anticoagulation.  He presents today for further evaluation of increasing palpitations.   He has been to the emergency room twice this month already.  He presented on April 5 for further evaluation of palpitations.  He returned on April 12 with abdominal pain and symptoms consistent with ileus.   He has had more palpitations. Woke up with more palpitatons.   lke a puase  May last several minutes.    Was placed on Coreg along with Dilt He was previously taking coreg only 1 a day  Now is on 18.75 mg PO BID  Potassium was found to be low - kdur was increased   Has a event monitor   No cough, fever, no aches    Evaluation Performed:  Follow-Up Visit  Chief Complaint:  palpitations  Aug. 20, 2020 - vrtual visit    Jon Collins is a 50 y.o. male with PAF .  No recent episodes of palpitations.  No cp or dyspnea.  Was in the ER Nov 24, 2018 for abdominal pain  Work up was negative     .Sept 2, 2021 Jon Collins is seen today for follow up visit for his PAF and HTN  Jon Collins is seen today for follow-up visit. Has gained some weight . Working  on that Has had Both covid vaccines.   Nov. 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 Dx 04/2012 in ER. Event monitor 07/2012 for palpitations - pt triggers correlated with NSR only.   MVP (mitral valve prolapse)    a. Pt reported dx at age 37, NOT seen on echo. 2D echo 05/2012: EF 55-60%, no RWMA, grade 2 diastolic dysfunction, no significant valvular abnormalities.   Reduced libido 11/09/2017   Sleep apnea    Past Surgical History:  Procedure Laterality Date   NO PAST SURGERIES       Current Meds  Medication Sig   aspirin EC 81 MG tablet Take 81 mg by mouth daily.   atorvastatin (LIPITOR) 20 MG tablet TAKE ONE TABLET BY MOUTH ONCE DAILY   carvedilol (COREG) 12.5 MG tablet TAKE 1.5 TABLETS (18.75 MG TOTAL) BY MOUTH 2 (TWO) TIMES DAILY WITH A MEAL.   diltiazem (TIAZAC) 180 MG 24 hr capsule Take 1 capsule by mouth daily.   hydrochlorothiazide (HYDRODIURIL) 25 MG tablet Take 25 mg by mouth daily.   sertraline (ZOLOFT) 50 MG tablet Take 50 mg by mouth daily.   tadalafil (CIALIS) 5 MG tablet Take 1 tablet by mouth as needed.     Allergies:   Amoxicillin   Social History   Tobacco Use  Smoking status: Never   Smokeless tobacco: Never  Vaping Use   Vaping Use: Never used  Substance Use Topics   Alcohol use: No    Alcohol/week: 0.0 standard drinks   Drug use: No     Family Hx: The patient's family history includes Hyperlipidemia in his father; Stroke in his maternal grandmother. There is no history of Heart attack.  ROS:   Please see the history of present illness.     All other systems reviewed and are negative.   Prior CV studies:   The following studies were reviewed today:    Labs/Other Tests and Data Reviewed:     Recent Labs: No results found for requested labs within last 8760 hours.   Recent Lipid Panel Lab Results  Component Value Date/Time   CHOL 147 03/10/2020 07:30 AM   TRIG 110 03/10/2020 07:30 AM   HDL 41 03/10/2020 07:30 AM   CHOLHDL 3.6 03/10/2020  07:30 AM   CHOLHDL 4.3 12/01/2015 08:56 AM   LDLCALC 86 03/10/2020 07:30 AM   LDLDIRECT 176.4 02/19/2013 11:43 AM    Wt Readings from Last 3 Encounters:  05/31/21 228 lb 9.6 oz (103.7 kg)  03/12/20 230 lb 12.8 oz (104.7 kg)  08/21/19 222 lb (100.7 kg)     Objective:    Physical Exam: Blood pressure 138/82, pulse 67, height 5\' 8"  (1.727 m), weight 228 lb 9.6 oz (103.7 kg), SpO2 99 %.  GEN:  Well nourished, well developed in no acute distress HEENT: Normal NECK: No JVD; No carotid bruits LYMPHATICS: No lymphadenopathy CARDIAC: RRR , no murmurs, rubs, gallops RESPIRATORY:  Clear to auscultation without rales, wheezing or rhonchi  ABDOMEN: Soft, non-tender, non-distended MUSCULOSKELETAL:  No edema; No deformity  SKIN: Warm and dry NEUROLOGIC:  Alert and oriented x 3  ECG: May 31, 2021: Normal sinus rhythm at 67.  Early repolarization changes.  No changes from previous EKG.  ASSESSMENT & PLAN:     Paroxysmal atrial fibrillation:.  No recurent Afib .   CHADS2VASC is 1 ( HTN)  is on ASA 81.   Does not have any complications from taking the ASA .  Cont current meds.    2.  Hypertension:  cont meds.   Advised dietary improvements   3.  hyperlipidema -  will draw labs today   To see me or APP in 1 year.      Medication Adjustments/Labs and Tests Ordered: Current medicines are reviewed at length with the patient today.  Concerns regarding medicines are outlined above.   Tests Ordered: Orders Placed This Encounter  Procedures   ALT   Basic metabolic panel   Lipid panel   EKG 12-Lead     Medication Changes: No orders of the defined types were placed in this encounter.   Follow Up: in 1 year   Signed, June 02, 2021, MD  05/31/2021 1:59 PM    [facility]

## 2021-05-31 ENCOUNTER — Other Ambulatory Visit: Payer: Self-pay

## 2021-05-31 ENCOUNTER — Encounter: Payer: Self-pay | Admitting: Cardiovascular Disease

## 2021-05-31 ENCOUNTER — Ambulatory Visit (INDEPENDENT_AMBULATORY_CARE_PROVIDER_SITE_OTHER): Payer: 59 | Admitting: Cardiovascular Disease

## 2021-05-31 VITALS — BP 138/82 | HR 67 | Ht 68.0 in | Wt 228.6 lb

## 2021-05-31 DIAGNOSIS — I480 Paroxysmal atrial fibrillation: Secondary | ICD-10-CM

## 2021-05-31 DIAGNOSIS — E782 Mixed hyperlipidemia: Secondary | ICD-10-CM

## 2021-05-31 DIAGNOSIS — I10 Essential (primary) hypertension: Secondary | ICD-10-CM

## 2021-05-31 NOTE — Patient Instructions (Signed)
Medication Instructions:  Your physician recommends that you continue on your current medications as directed. Please refer to the Current Medication list given to you today.  *If you need a refill on your cardiac medications before your next appointment, please call your pharmacy*   Lab Work: Today: Lipids, ALT, BMP  If you have labs (blood work) drawn today and your tests are completely normal, you will receive your results only by: MyChart Message (if you have MyChart) OR A paper copy in the mail If you have any lab test that is abnormal or we need to change your treatment, we will call you to review the results.   Follow-Up: At [facility], you and your health needs are our priority.  As part of our continuing mission to provide you with exceptional heart care, we have created designated Provider Care Teams.  These Care Teams include your primary Cardiologist (physician) and Advanced Practice Providers (APPs -  Physician Assistants and Nurse Practitioners) who all work together to provide you with the care you need, when you need it.  We recommend signing up for the patient portal called "MyChart".  Sign up information is provided on this After Visit Summary.  MyChart is used to connect with patients for Virtual Visits (Telemedicine).  Patients are able to view lab/test results, encounter notes, upcoming appointments, etc.  Non-urgent messages can be sent to your provider as well.   To learn more about what you can do with MyChart, go to [email or web address].    Your next appointment:   1 year(s)  The format for your next appointment:   In Person  Provider:   Kristeen Miss, MD

## 2021-06-01 LAB — SPECIMEN STATUS

## 2021-06-04 LAB — BASIC METABOLIC PANEL
BUN/Creatinine Ratio: 15 (ref 9–20)
BUN: 15 mg/dL (ref 6–24)
CO2: 28 mmol/L (ref 20–29)
Calcium: 10.4 mg/dL — ABNORMAL HIGH (ref 8.7–10.2)
Chloride: 97 mmol/L (ref 96–106)
Creatinine, Ser: 1.01 mg/dL (ref 0.76–1.27)
Glucose: 94 mg/dL (ref 70–99)
Potassium: 4 mmol/L (ref 3.5–5.2)
Sodium: 137 mmol/L (ref 134–144)
eGFR: 90 mL/min/{1.73_m2} (ref >59)

## 2021-06-04 LAB — LIPID PANEL
Chol/HDL Ratio: 4.1 ratio (ref 0.0–5.0)
Cholesterol, Total: 173 mg/dL (ref 100–199)
HDL: 42 mg/dL (ref >39)
LDL Chol Calc (NIH): 99 mg/dL (ref 0–99)
Triglycerides: 182 mg/dL — ABNORMAL HIGH (ref 0–149)
VLDL Cholesterol Cal: 32 mg/dL (ref 5–40)

## 2021-06-04 LAB — SPECIMEN STATUS REPORT

## 2021-06-04 LAB — ALT: ALT: 29 IU/L (ref 0–44)

## 2021-06-07 ENCOUNTER — Telehealth: Payer: Self-pay | Admitting: Cardiovascular Disease

## 2021-06-07 NOTE — Telephone Encounter (Signed)
Patient call for test results.

## 2021-06-07 NOTE — Telephone Encounter (Signed)
Spoke with pt and advised of lab results per Dr Elease Hashimoto:  Jon Collins are mildly elevated.  He needs to watch his diet Chol levels are ok Cont meds  Pt advised to limit saturated and trans fates in diet.  Reduce carbs and added sugars.  Pt verbalizes understanding and agrees with current plan.

## 2021-07-27 ENCOUNTER — Ambulatory Visit: Payer: 59 | Admitting: Cardiovascular Disease

## 2022-02-24 DIAGNOSIS — M7551 Bursitis of right shoulder: Secondary | ICD-10-CM | POA: Diagnosis not present

## 2022-02-24 DIAGNOSIS — M545 Low back pain, unspecified: Secondary | ICD-10-CM | POA: Diagnosis not present

## 2022-02-24 DIAGNOSIS — R7989 Other specified abnormal findings of blood chemistry: Secondary | ICD-10-CM | POA: Diagnosis not present

## 2022-02-24 DIAGNOSIS — E7800 Pure hypercholesterolemia, unspecified: Secondary | ICD-10-CM | POA: Diagnosis not present

## 2022-02-24 DIAGNOSIS — Z0000 Encounter for general adult medical examination without abnormal findings: Secondary | ICD-10-CM | POA: Diagnosis not present

## 2022-02-24 DIAGNOSIS — Z125 Encounter for screening for malignant neoplasm of prostate: Secondary | ICD-10-CM | POA: Diagnosis not present

## 2022-02-24 DIAGNOSIS — I10 Essential (primary) hypertension: Secondary | ICD-10-CM | POA: Diagnosis not present

## 2022-03-07 DIAGNOSIS — M7551 Bursitis of right shoulder: Secondary | ICD-10-CM | POA: Diagnosis not present

## 2022-03-07 DIAGNOSIS — M545 Low back pain, unspecified: Secondary | ICD-10-CM | POA: Diagnosis not present

## 2022-03-07 DIAGNOSIS — G8929 Other chronic pain: Secondary | ICD-10-CM | POA: Diagnosis not present

## 2022-03-07 DIAGNOSIS — M7552 Bursitis of left shoulder: Secondary | ICD-10-CM | POA: Diagnosis not present

## 2022-03-09 DIAGNOSIS — G8929 Other chronic pain: Secondary | ICD-10-CM | POA: Diagnosis not present

## 2022-03-09 DIAGNOSIS — M7551 Bursitis of right shoulder: Secondary | ICD-10-CM | POA: Diagnosis not present

## 2022-03-09 DIAGNOSIS — M545 Low back pain, unspecified: Secondary | ICD-10-CM | POA: Diagnosis not present

## 2022-03-09 DIAGNOSIS — M7552 Bursitis of left shoulder: Secondary | ICD-10-CM | POA: Diagnosis not present

## 2022-03-21 DIAGNOSIS — M25512 Pain in left shoulder: Secondary | ICD-10-CM | POA: Diagnosis not present

## 2022-03-21 DIAGNOSIS — M5412 Radiculopathy, cervical region: Secondary | ICD-10-CM | POA: Diagnosis not present

## 2022-03-21 DIAGNOSIS — M25511 Pain in right shoulder: Secondary | ICD-10-CM | POA: Diagnosis not present

## 2022-03-21 DIAGNOSIS — R293 Abnormal posture: Secondary | ICD-10-CM | POA: Diagnosis not present

## 2022-03-23 DIAGNOSIS — M25512 Pain in left shoulder: Secondary | ICD-10-CM | POA: Diagnosis not present

## 2022-03-23 DIAGNOSIS — M5412 Radiculopathy, cervical region: Secondary | ICD-10-CM | POA: Diagnosis not present

## 2022-03-23 DIAGNOSIS — M25511 Pain in right shoulder: Secondary | ICD-10-CM | POA: Diagnosis not present

## 2022-03-23 DIAGNOSIS — R293 Abnormal posture: Secondary | ICD-10-CM | POA: Diagnosis not present

## 2022-03-25 DIAGNOSIS — M25562 Pain in left knee: Secondary | ICD-10-CM | POA: Diagnosis not present

## 2022-03-25 DIAGNOSIS — I10 Essential (primary) hypertension: Secondary | ICD-10-CM | POA: Diagnosis not present

## 2022-03-25 DIAGNOSIS — M25561 Pain in right knee: Secondary | ICD-10-CM | POA: Diagnosis not present

## 2022-03-28 DIAGNOSIS — R293 Abnormal posture: Secondary | ICD-10-CM | POA: Diagnosis not present

## 2022-03-28 DIAGNOSIS — M25512 Pain in left shoulder: Secondary | ICD-10-CM | POA: Diagnosis not present

## 2022-03-28 DIAGNOSIS — M25511 Pain in right shoulder: Secondary | ICD-10-CM | POA: Diagnosis not present

## 2022-03-28 DIAGNOSIS — M5412 Radiculopathy, cervical region: Secondary | ICD-10-CM | POA: Diagnosis not present

## 2022-03-29 DIAGNOSIS — M25561 Pain in right knee: Secondary | ICD-10-CM | POA: Diagnosis not present

## 2022-03-29 DIAGNOSIS — M25562 Pain in left knee: Secondary | ICD-10-CM | POA: Diagnosis not present

## 2022-04-04 DIAGNOSIS — M25511 Pain in right shoulder: Secondary | ICD-10-CM | POA: Diagnosis not present

## 2022-04-04 DIAGNOSIS — M25512 Pain in left shoulder: Secondary | ICD-10-CM | POA: Diagnosis not present

## 2022-04-04 DIAGNOSIS — R293 Abnormal posture: Secondary | ICD-10-CM | POA: Diagnosis not present

## 2022-04-04 DIAGNOSIS — M5412 Radiculopathy, cervical region: Secondary | ICD-10-CM | POA: Diagnosis not present

## 2022-04-06 DIAGNOSIS — I10 Essential (primary) hypertension: Secondary | ICD-10-CM | POA: Diagnosis not present

## 2022-04-06 DIAGNOSIS — M25562 Pain in left knee: Secondary | ICD-10-CM | POA: Diagnosis not present

## 2022-04-06 DIAGNOSIS — M25561 Pain in right knee: Secondary | ICD-10-CM | POA: Diagnosis not present

## 2022-04-07 DIAGNOSIS — R293 Abnormal posture: Secondary | ICD-10-CM | POA: Diagnosis not present

## 2022-04-07 DIAGNOSIS — M25512 Pain in left shoulder: Secondary | ICD-10-CM | POA: Diagnosis not present

## 2022-04-07 DIAGNOSIS — M5412 Radiculopathy, cervical region: Secondary | ICD-10-CM | POA: Diagnosis not present

## 2022-04-07 DIAGNOSIS — M25511 Pain in right shoulder: Secondary | ICD-10-CM | POA: Diagnosis not present

## 2022-04-13 DIAGNOSIS — M545 Low back pain, unspecified: Secondary | ICD-10-CM | POA: Diagnosis not present

## 2022-04-13 DIAGNOSIS — M7551 Bursitis of right shoulder: Secondary | ICD-10-CM | POA: Diagnosis not present

## 2022-04-13 DIAGNOSIS — G8929 Other chronic pain: Secondary | ICD-10-CM | POA: Diagnosis not present

## 2022-04-13 DIAGNOSIS — M25511 Pain in right shoulder: Secondary | ICD-10-CM | POA: Diagnosis not present

## 2022-04-21 DIAGNOSIS — G8929 Other chronic pain: Secondary | ICD-10-CM | POA: Diagnosis not present

## 2022-04-21 DIAGNOSIS — M25511 Pain in right shoulder: Secondary | ICD-10-CM | POA: Diagnosis not present

## 2022-04-21 DIAGNOSIS — M7551 Bursitis of right shoulder: Secondary | ICD-10-CM | POA: Diagnosis not present

## 2022-04-21 DIAGNOSIS — M545 Low back pain, unspecified: Secondary | ICD-10-CM | POA: Diagnosis not present

## 2022-04-25 DIAGNOSIS — M545 Low back pain, unspecified: Secondary | ICD-10-CM | POA: Diagnosis not present

## 2022-04-25 DIAGNOSIS — M222X2 Patellofemoral disorders, left knee: Secondary | ICD-10-CM | POA: Diagnosis not present

## 2022-04-25 DIAGNOSIS — M25511 Pain in right shoulder: Secondary | ICD-10-CM | POA: Diagnosis not present

## 2022-04-25 DIAGNOSIS — G8929 Other chronic pain: Secondary | ICD-10-CM | POA: Diagnosis not present

## 2022-04-25 DIAGNOSIS — I10 Essential (primary) hypertension: Secondary | ICD-10-CM | POA: Diagnosis not present

## 2022-04-25 DIAGNOSIS — M7551 Bursitis of right shoulder: Secondary | ICD-10-CM | POA: Diagnosis not present

## 2022-04-25 DIAGNOSIS — M222X1 Patellofemoral disorders, right knee: Secondary | ICD-10-CM | POA: Diagnosis not present

## 2022-05-06 DIAGNOSIS — Z86010 Personal history of colonic polyps: Secondary | ICD-10-CM | POA: Diagnosis not present

## 2022-05-06 DIAGNOSIS — K5900 Constipation, unspecified: Secondary | ICD-10-CM | POA: Diagnosis not present

## 2022-05-06 DIAGNOSIS — Z8719 Personal history of other diseases of the digestive system: Secondary | ICD-10-CM | POA: Diagnosis not present

## 2022-05-06 DIAGNOSIS — R140 Abdominal distension (gaseous): Secondary | ICD-10-CM | POA: Diagnosis not present

## 2022-05-10 DIAGNOSIS — I10 Essential (primary) hypertension: Secondary | ICD-10-CM | POA: Diagnosis not present

## 2022-05-10 DIAGNOSIS — M25361 Other instability, right knee: Secondary | ICD-10-CM | POA: Diagnosis not present

## 2022-05-10 DIAGNOSIS — M25362 Other instability, left knee: Secondary | ICD-10-CM | POA: Diagnosis not present

## 2022-05-10 DIAGNOSIS — K5909 Other constipation: Secondary | ICD-10-CM | POA: Diagnosis not present

## 2022-05-13 DIAGNOSIS — K5900 Constipation, unspecified: Secondary | ICD-10-CM | POA: Diagnosis not present

## 2022-05-16 DIAGNOSIS — M222X2 Patellofemoral disorders, left knee: Secondary | ICD-10-CM | POA: Diagnosis not present

## 2022-05-16 DIAGNOSIS — M222X1 Patellofemoral disorders, right knee: Secondary | ICD-10-CM | POA: Diagnosis not present

## 2022-06-07 DIAGNOSIS — M222X2 Patellofemoral disorders, left knee: Secondary | ICD-10-CM | POA: Diagnosis not present

## 2022-06-07 DIAGNOSIS — M222X1 Patellofemoral disorders, right knee: Secondary | ICD-10-CM | POA: Diagnosis not present

## 2022-06-15 DIAGNOSIS — M222X2 Patellofemoral disorders, left knee: Secondary | ICD-10-CM | POA: Diagnosis not present

## 2022-06-15 DIAGNOSIS — M222X1 Patellofemoral disorders, right knee: Secondary | ICD-10-CM | POA: Diagnosis not present

## 2022-06-22 DIAGNOSIS — M222X1 Patellofemoral disorders, right knee: Secondary | ICD-10-CM | POA: Diagnosis not present

## 2022-06-22 DIAGNOSIS — M222X2 Patellofemoral disorders, left knee: Secondary | ICD-10-CM | POA: Diagnosis not present

## 2022-06-29 DIAGNOSIS — M222X1 Patellofemoral disorders, right knee: Secondary | ICD-10-CM | POA: Diagnosis not present

## 2022-06-29 DIAGNOSIS — M222X2 Patellofemoral disorders, left knee: Secondary | ICD-10-CM | POA: Diagnosis not present

## 2022-06-30 ENCOUNTER — Ambulatory Visit: Payer: 59 | Admitting: Cardiovascular Disease

## 2022-07-26 ENCOUNTER — Ambulatory Visit: Payer: Self-pay | Admitting: Physician Assistant

## 2022-07-26 DIAGNOSIS — E782 Mixed hyperlipidemia: Secondary | ICD-10-CM

## 2022-07-26 DIAGNOSIS — I10 Essential (primary) hypertension: Secondary | ICD-10-CM

## 2022-07-26 DIAGNOSIS — I480 Paroxysmal atrial fibrillation: Secondary | ICD-10-CM

## 2022-07-26 NOTE — Progress Notes (Unsigned)
Office Visit    Patient Name: Jon Collins Date of Encounter: 07/26/2022  PCP:  Erie Noe, MD   [place]  Cardiologist:  Mertie Moores, MD  Advanced Practice Provider:  No care team member to display Electrophysiologist:  None    HPI    Jon Collins is a 52 y.o. male with a past medical history significant for hypertension, anxiety, hyperlipidemia, atrial fibrillation, MVP (reported diagnosis at age 38, not seen on echocardiogram) presents today for annual follow-up appointment.  He was last seen 05/31/2021 and at that time he was doing well.  Worked as a Secondary school teacher.  Only on ASA, CHA2DS2-VASc score of 1.  History of PAF and hypertension.  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 Dx 04/2012 in ER. Event monitor 07/2012 for palpitations - pt triggers correlated with NSR only.   MVP (mitral valve prolapse)    a. Pt reported dx at age 31, NOT seen on echo. 2D echo 05/2012: EF 55-60%, no RWMA, grade 2 diastolic dysfunction, no significant valvular abnormalities.   Reduced libido 11/09/2017   Sleep apnea    Past Surgical History:  Procedure Laterality Date   NO PAST SURGERIES      Allergies  Allergies  Allergen Reactions   Amoxicillin Hives    EKGs/Labs/Other Studies Reviewed:   The following studies were reviewed today:  Echocardiogram 07/11/2019  IMPRESSIONS     1. Left ventricular ejection fraction, by visual estimation, is 60 to  65%. The left ventricle has normal function. Left ventricular septal wall  thickness was moderately increased. Mildly increased left ventricular  posterior wall thickness. There is  moderately increased left  ventricular hypertrophy.   2. Left ventricular diastolic parameters are indeterminate.   3. The left ventricle has no regional wall motion abnormalities.   4. Global right ventricle has normal systolic function.The right  ventricular size is normal. No increase in right ventricular wall  thickness.   5. Left atrial size was normal.   6. Right atrial size was normal.   7. The mitral valve is normal in structure. No evidence of mitral valve  regurgitation. No evidence of mitral stenosis.   8. The tricuspid valve is normal in structure.   9. The aortic valve is tricuspid. Aortic valve regurgitation is not  visualized. No evidence of aortic valve sclerosis or stenosis.  10. The pulmonic valve was normal in structure. Pulmonic valve  regurgitation is trivial.  11. Normal pulmonary artery systolic pressure.  12. The inferior vena cava is normal in size with greater than 50%  respiratory variability, suggesting right atrial pressure of 3 mmHg.   FINDINGS   Left Ventricle: Left ventricular ejection fraction, by visual estimation,  is 60 to 65%. The left ventricle has normal function. The left ventricle  has no regional wall motion abnormalities. Mildly increased left  ventricular posterior wall thickness.  There is moderately increased left ventricular hypertrophy. Left  ventricular diastolic parameters are indeterminate. Indeterminate filling  pressures.   Right Ventricle: The right ventricular size is normal. No increase in  right ventricular wall thickness. Global RV systolic function is has  normal systolic function. The tricuspid regurgitant velocity is 2.14 m/s,  and with an assumed right atrial pressure   of 3 mmHg, the estimated right ventricular systolic pressure is normal at  21.3 mmHg.   Left Atrium: Left atrial size was normal in size.   Right Atrium: Right atrial size was normal in size   Pericardium: There is no evidence of pericardial effusion.   Mitral Valve: The  mitral valve is normal in structure. No evidence of  mitral valve regurgitation. No evidence of mitral valve stenosis by  observation.   Tricuspid Valve: The tricuspid valve is normal in structure. Tricuspid  valve regurgitation is trivial.   Aortic Valve: The aortic valve is tricuspid. Aortic valve regurgitation is  not visualized. The aortic valve is structurally normal, with no evidence  of sclerosis or stenosis.   Pulmonic Valve: The pulmonic valve was normal in structure. Pulmonic valve  regurgitation is trivial. Pulmonic regurgitation is trivial.   Aorta: The aortic root, ascending aorta and aortic arch are all  structurally normal, with no evidence of dilitation or obstruction.   Venous: The inferior vena cava is normal in size with greater than 50%  respiratory variability, suggesting right atrial pressure of 3 mmHg.   IAS/Shunts: No atrial level shunt detected by color flow Doppler. There is  no evidence of a patent foramen ovale. No ventricular septal defect is  seen or detected. There is no evidence of an atrial septal defect.       EKG:  EKG is *** ordered today.  The ekg ordered today demonstrates ***  Recent Labs: No results found for requested labs within last 365 days.  Recent Lipid Panel    Component Value Date/Time   CHOL 173 05/31/2021 1400   TRIG 182 (H) 05/31/2021 1400   HDL 42 05/31/2021 1400   CHOLHDL 4.1 05/31/2021 1400   CHOLHDL 4.3 12/01/2015 0856   VLDL 22 12/01/2015 0856   LDLCALC 99 05/31/2021 1400   LDLDIRECT 176.4 02/19/2013 1143    Risk Assessment/Calculations:  {Does this patient have ATRIAL FIBRILLATION?:[phone]}  Home Medications   No outpatient medications have been marked as taking for the 07/26/22 encounter (Appointment) with Elgie Collard, PA-C.     Review of Systems   ***   All other systems reviewed and are otherwise negative except as noted above.  Physical Exam    VS:  There were no vitals taken for this visit. ,  BMI There is no height or weight on file to calculate BMI.  Wt Readings from Last 3 Encounters:  05/31/21 228 lb 9.6 oz (103.7 kg)  03/12/20 230 lb 12.8 oz (104.7 kg)  08/21/19 222 lb (100.7 kg)     GEN: Well nourished, well developed, in no acute distress. HEENT: normal. Neck: Supple, no JVD, carotid bruits, or masses. Cardiac: ***RRR, no murmurs, rubs, or gallops. No clubbing, cyanosis, edema.  ***Radials/PT 2+ and equal bilaterally.  Respiratory:  ***Respirations regular and unlabored, clear to auscultation bilaterally. GI: Soft, nontender, nondistended. MS: No deformity or atrophy. Skin: Warm and dry, no rash. Neuro:  Strength and sensation are intact. Psych: Normal affect.  Assessment & Plan    Paroxysmal atrial fibrillation Hypertension Hyperlipidemia  No BP recorded.  {Refresh Note OR Click here to enter BP  :1}***      Disposition: Follow up {follow up:15908} with Mertie Moores, MD or APP.  Signed, Elgie Collard, PA-C 07/26/2022, 1:10 PM [facility]

## 2022-07-27 ENCOUNTER — Ambulatory Visit: Payer: Self-pay | Admitting: Physician Assistant

## 2022-08-03 DIAGNOSIS — M7551 Bursitis of right shoulder: Secondary | ICD-10-CM | POA: Diagnosis not present

## 2022-08-03 DIAGNOSIS — I10 Essential (primary) hypertension: Secondary | ICD-10-CM | POA: Diagnosis not present

## 2022-08-03 DIAGNOSIS — K5909 Other constipation: Secondary | ICD-10-CM | POA: Diagnosis not present

## 2022-08-03 DIAGNOSIS — M25361 Other instability, right knee: Secondary | ICD-10-CM | POA: Diagnosis not present

## 2022-08-18 ENCOUNTER — Ambulatory Visit: Payer: Self-pay | Admitting: Physician Assistant

## 2022-08-18 NOTE — Progress Notes (Deleted)
Office Visit    Patient Name: Jon Collins Date of Encounter: 08/18/2022  PCP:  Erie Noe, MD   [place]  Cardiologist:  Mertie Moores, MD  Advanced Practice Provider:  No care team member to display Electrophysiologist:  None    HPI    Jon Collins is a 52 y.o. male with a past medical history significant for hypertension, anxiety, hyperlipidemia, atrial fibrillation, MVP (reported diagnosis at age 10, not seen on echocardiogram) presents today for annual follow-up appointment.  He was last seen 05/31/2021 and at that time he was doing well.  Worked as a Secondary school teacher.  Only on ASA, CHA2DS2-VASc score of 1.  History of PAF and hypertension.  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 Dx 04/2012 in ER. Event monitor 07/2012 for palpitations - pt triggers correlated with NSR only.   MVP (mitral valve prolapse)    a. Pt reported dx at age 62, NOT seen on echo. 2D echo 05/2012: EF 55-60%, no RWMA, grade 2 diastolic dysfunction, no significant valvular abnormalities.   Reduced libido 11/09/2017   Sleep apnea    Past Surgical History:  Procedure Laterality Date   NO PAST SURGERIES      Allergies  Allergies  Allergen Reactions   Amoxicillin Hives    EKGs/Labs/Other Studies Reviewed:   The following studies were reviewed today:  Echocardiogram 07/11/2019  IMPRESSIONS     1. Left ventricular ejection fraction, by visual estimation, is 60 to  65%. The left ventricle has normal function. Left ventricular septal wall  thickness was moderately increased. Mildly increased left ventricular  posterior wall thickness. There is  moderately increased left ventricular  hypertrophy.   2. Left ventricular diastolic parameters are indeterminate.   3. The left ventricle has no regional wall motion abnormalities.   4. Global right ventricle has normal systolic function.The right  ventricular size is normal. No increase in right ventricular wall  thickness.   5. Left atrial size was normal.   6. Right atrial size was normal.   7. The mitral valve is normal in structure. No evidence of mitral valve  regurgitation. No evidence of mitral stenosis.   8. The tricuspid valve is normal in structure.   9. The aortic valve is tricuspid. Aortic valve regurgitation is not  visualized. No evidence of aortic valve sclerosis or stenosis.  10. The pulmonic valve was normal in structure. Pulmonic valve  regurgitation is trivial.  11. Normal pulmonary artery systolic pressure.  12. The inferior vena cava is normal in size with greater than 50%  respiratory variability, suggesting right atrial pressure of 3 mmHg.   FINDINGS   Left Ventricle: Left ventricular ejection fraction, by visual estimation,  is 60 to 65%. The left ventricle has normal function. The left ventricle  has no regional wall motion abnormalities. Mildly increased left  ventricular posterior wall thickness.  There is moderately increased left ventricular hypertrophy. Left  ventricular diastolic parameters are indeterminate. Indeterminate filling  pressures.   Right Ventricle: The right ventricular size is normal. No increase in  right ventricular wall thickness. Global RV systolic function is has  normal systolic function. The tricuspid regurgitant velocity is 2.14 m/s,  and with an assumed right atrial pressure   of 3 mmHg, the estimated right ventricular systolic pressure is normal at  21.3 mmHg.   Left Atrium: Left atrial size was normal in size.   Right Atrium: Right atrial size was normal in size   Pericardium: There is no evidence of pericardial effusion.   Mitral Valve: The mitral valve is  normal in structure. No evidence of  mitral valve regurgitation. No evidence of mitral valve stenosis by  observation.   Tricuspid Valve: The tricuspid valve is normal in structure. Tricuspid  valve regurgitation is trivial.   Aortic Valve: The aortic valve is tricuspid. Aortic valve regurgitation is  not visualized. The aortic valve is structurally normal, with no evidence  of sclerosis or stenosis.   Pulmonic Valve: The pulmonic valve was normal in structure. Pulmonic valve  regurgitation is trivial. Pulmonic regurgitation is trivial.   Aorta: The aortic root, ascending aorta and aortic arch are all  structurally normal, with no evidence of dilitation or obstruction.   Venous: The inferior vena cava is normal in size with greater than 50%  respiratory variability, suggesting right atrial pressure of 3 mmHg.   IAS/Shunts: No atrial level shunt detected by color flow Doppler. There is  no evidence of a patent foramen ovale. No ventricular septal defect is  seen or detected. There is no evidence of an atrial septal defect.       EKG:  EKG is *** ordered today.  The ekg ordered today demonstrates ***  Recent Labs: No results found for requested labs within last 365 days.  Recent Lipid Panel    Component Value Date/Time   CHOL 173 05/31/2021 1400   TRIG 182 (H) 05/31/2021 1400   HDL 42 05/31/2021 1400   CHOLHDL 4.1 05/31/2021 1400   CHOLHDL 4.3 12/01/2015 0856   VLDL 22 12/01/2015 0856   LDLCALC 99 05/31/2021 1400   LDLDIRECT 176.4 02/19/2013 1143    Risk Assessment/Calculations:  {Does this patient have ATRIAL FIBRILLATION?:[phone]}  Home Medications   No outpatient medications have been marked as taking for the 08/18/22 encounter (Appointment) with Elgie Collard, PA-C.     Review of Systems   ***   All other systems reviewed and are otherwise negative except as noted above.  Physical Exam    VS:  There were no vitals taken for this visit. , BMI There is no  height or weight on file to calculate BMI.  Wt Readings from Last 3 Encounters:  05/31/21 228 lb 9.6 oz (103.7 kg)  03/12/20 230 lb 12.8 oz (104.7 kg)  08/21/19 222 lb (100.7 kg)     GEN: Well nourished, well developed, in no acute distress. HEENT: normal. Neck: Supple, no JVD, carotid bruits, or masses. Cardiac: ***RRR, no murmurs, rubs, or gallops. No clubbing, cyanosis, edema.  ***Radials/PT 2+ and equal bilaterally.  Respiratory:  ***Respirations regular and unlabored, clear to auscultation bilaterally. GI: Soft, nontender, nondistended. MS: No deformity or atrophy. Skin: Warm and dry, no rash. Neuro:  Strength and sensation are intact. Psych: Normal affect.  Assessment & Plan    Paroxysmal atrial fibrillation Hypertension Hyperlipidemia  No BP recorded.  {Refresh Note OR Click here to enter BP  :1}***      Disposition: Follow up {follow up:15908} with Mertie Moores, MD or APP.  Signed, Elgie Collard, PA-C 08/18/2022, 10:50 AM [place] [facility]

## 2022-09-05 ENCOUNTER — Encounter: Payer: Self-pay | Admitting: Cardiovascular Disease

## 2022-09-05 NOTE — Progress Notes (Signed)
This encounter was created in error - please disregard.

## 2022-09-06 ENCOUNTER — Ambulatory Visit: Payer: Self-pay | Admitting: Cardiovascular Disease

## 2022-09-06 ENCOUNTER — Encounter: Payer: Self-pay | Admitting: Cardiovascular Disease

## 2022-09-06 NOTE — Progress Notes (Signed)
This encounter was created in error - please disregard.

## 2022-09-07 ENCOUNTER — Encounter: Payer: Self-pay | Admitting: Cardiovascular Disease

## 2022-09-07 ENCOUNTER — Telehealth: Payer: Self-pay | Admitting: Cardiovascular Disease

## 2022-09-07 NOTE — Telephone Encounter (Signed)
Pt called in wanting to know if he can r/s his appt as a mychart visit with Dr. Acie Fredrickson. Pt has to work and has a hard time physically getting to his appts. I did not want to sch mychart visit without permission first.

## 2022-10-05 ENCOUNTER — Ambulatory Visit: Payer: Self-pay | Admitting: Physician Assistant

## 2022-10-26 NOTE — Progress Notes (Deleted)
Office Visit    Patient Name: Jon Collins Date of Encounter: 10/26/2022  Primary Care Provider:  [name], MD Primary Cardiologist:  Kristeen Miss, MD Primary Electrophysiologist: None  Chief Complaint    Jon Collins is a 52 y.o. male with PMH of HTN, HLD, anxiety, PAF ,MVP OSA who presents today for 1 year follow-up.  Past Medical History    Past Medical History:  Diagnosis Date   Anxiety    Decreased testosterone level 11/07/2017   Diverticulitis of large intestine without perforation or abscess with bleeding 09/30/2014   HTN (hypertension)    Hyperlipidemia 04/14/2015   Lone atrial fibrillation (HCC)    a. Dx 04/2012 in ER. Event monitor 07/2012 for palpitations - pt triggers correlated with NSR only.   MVP (mitral valve prolapse)    a. Pt reported dx at age 26, NOT seen on echo. 2D echo 05/2012: EF 55-60%, no RWMA, grade 2 diastolic dysfunction, no significant valvular abnormalities.   Reduced libido 11/09/2017   Sleep apnea    Past Surgical History:  Procedure Laterality Date   NO PAST SURGERIES      Allergies  Allergies  Allergen Reactions   Amoxicillin Hives    History of Present Illness    Jon Collins  is a 52 year old male with the above mention past medical history who presents today for 1 year follow-up.  Jon Collins was initially seen by Dr. Elease Collins and 2013 for evaluation of atrial fibrillation.  He is currently not on anticoagulation due to CHA2DS2-VASc of 1.  He had 2D echo completed on 07/15/2019 with EF of 60 to 65% and mildly increased LV wall thickness and no evidence of MV regurgitation.  He was last seen by Dr. Elease Collins on 05/2021 he had no interval changes made and continue to take ASA 81 mg.  Since last being seen in the office patient reports***.  Patient denies chest pain, palpitations, dyspnea, PND, orthopnea, nausea, vomiting, dizziness, syncope, edema, weight gain, or early satiety.     ***Notes:  Home Medications     Current Outpatient Medications  Medication Sig Dispense Refill   aspirin EC 81 MG tablet Take 81 mg by mouth daily.     atorvastatin (LIPITOR) 20 MG tablet TAKE ONE TABLET BY MOUTH ONCE DAILY 90 tablet 1   carvedilol (COREG) 12.5 MG tablet TAKE 1.5 TABLETS (18.75 MG TOTAL) BY MOUTH 2 (TWO) TIMES DAILY WITH A MEAL. 270 tablet 2   diltiazem (TIAZAC) 180 MG 24 hr capsule Take 1 capsule by mouth daily.     hydrochlorothiazide (HYDRODIURIL) 25 MG tablet Take 25 mg by mouth daily.     meloxicam (MOBIC) 15 MG tablet Take 15 mg by mouth as needed. (Patient not taking: Reported on 05/31/2021)     sertraline (ZOLOFT) 50 MG tablet Take 50 mg by mouth daily.     tadalafil (CIALIS) 5 MG tablet Take 1 tablet by mouth as needed.     No current facility-administered medications for this visit.     Review of Systems  Please see the history of present illness.    (+)*** (+)***  All other systems reviewed and are otherwise negative except as noted above.  Physical Exam    Wt Readings from Last 3 Encounters:  05/31/21 228 lb 9.6 oz (103.7 kg)  03/12/20 230 lb 12.8 oz (104.7 kg)  08/21/19 222 lb (100.7 kg)   WU:JWJXB were no vitals filed for this visit.,There is no height or weight on  file to calculate BMI.  Constitutional:      Appearance: Healthy appearance. Not in distress.  Neck:     Vascular: JVD normal.  Pulmonary:     Effort: Pulmonary effort is normal.     Breath sounds: No wheezing. No rales. Diminished in the bases Cardiovascular:     Normal rate. Regular rhythm. Normal S1. Normal S2.      Murmurs: There is no murmur.  Edema:    Peripheral edema absent.  Abdominal:     Palpations: Abdomen is soft non tender. There is no hepatomegaly.  Skin:    General: Skin is warm and dry.  Neurological:     General: No focal deficit present.     Mental Status: Alert and oriented to person, place and time.     Cranial Nerves: Cranial nerves are intact.  EKG/LABS/ Recent Cardiac Studies     ECG personally reviewed by me today - ***  Risk Assessment/Calculations:   {Does this patient have ATRIAL FIBRILLATION?:[phone]}        Lab Results  Component Value Date   WBC WILL FOLLOW 05/31/2021   HGB WILL FOLLOW 05/31/2021   HCT WILL FOLLOW 05/31/2021   MCV WILL FOLLOW 05/31/2021   PLT WILL FOLLOW 05/31/2021   Lab Results  Component Value Date   CREATININE 1.01 05/31/2021   BUN 15 05/31/2021   NA 137 05/31/2021   K 4.0 05/31/2021   CL 97 05/31/2021   CO2 28 05/31/2021   Lab Results  Component Value Date   ALT 29 05/31/2021   AST 19 03/10/2020   ALKPHOS 50 03/10/2020   BILITOT 0.6 03/10/2020   Lab Results  Component Value Date   CHOL 173 05/31/2021   HDL 42 05/31/2021   LDLCALC 99 05/31/2021   LDLDIRECT 176.4 02/19/2013   TRIG 182 (H) 05/31/2021   CHOLHDL 4.1 05/31/2021    No results found for: "HGBA1C"  Cardiac Studies & Procedures       ECHOCARDIOGRAM  ECHOCARDIOGRAM COMPLETE 07/11/2019  Narrative ECHOCARDIOGRAM REPORT    Patient Name:   Jon Collins Date of Exam: 07/11/2019 Medical Rec #:  [id number]       Height:       68.0 in Accession #:    [id number]      Weight:       208.0 lb Date of Birth:  1971-05-07      BSA:          2.08 m Patient Age:    48 years        BP:           128/84 mmHg Patient Gender: M               HR:           73 bpm. Exam Location:  [address]  Procedure: 2D Echo, Cardiac Doppler and Color Doppler  Indications:    R06.02 SOB  History:        Patient has prior history of Echocardiogram examinations, most recent 05/16/2012. Mitral Valve Prolapse, Arrythmias:Atrial Fibrillation; Risk Factors:Hypertension and Dyslipidemia. Palpitations. Sleep apnea.  Sonographer:    Cathie Beams RCS Referring Phys: [phone] PHILIP J NAHSER  IMPRESSIONS   1. Left ventricular ejection fraction, by visual estimation, is 60 to 65%. The left ventricle has normal function. Left ventricular septal wall thickness was  moderately increased. Mildly increased left ventricular posterior wall thickness. There is moderately increased left ventricular hypertrophy. 2. Left ventricular diastolic parameters are indeterminate. 3. The  left ventricle has no regional wall motion abnormalities. 4. Global right ventricle has normal systolic function.The right ventricular size is normal. No increase in right ventricular wall thickness. 5. Left atrial size was normal. 6. Right atrial size was normal. 7. The mitral valve is normal in structure. No evidence of mitral valve regurgitation. No evidence of mitral stenosis. 8. The tricuspid valve is normal in structure. 9. The aortic valve is tricuspid. Aortic valve regurgitation is not visualized. No evidence of aortic valve sclerosis or stenosis. 10. The pulmonic valve was normal in structure. Pulmonic valve regurgitation is trivial. 11. Normal pulmonary artery systolic pressure. 12. The inferior vena cava is normal in size with greater than 50% respiratory variability, suggesting right atrial pressure of 3 mmHg.  FINDINGS Left Ventricle: Left ventricular ejection fraction, by visual estimation, is 60 to 65%. The left ventricle has normal function. The left ventricle has no regional wall motion abnormalities. Mildly increased left ventricular posterior wall thickness. There is moderately increased left ventricular hypertrophy. Left ventricular diastolic parameters are indeterminate. Indeterminate filling pressures.  Right Ventricle: The right ventricular size is normal. No increase in right ventricular wall thickness. Global RV systolic function is has normal systolic function. The tricuspid regurgitant velocity is 2.14 m/s, and with an assumed right atrial pressure of 3 mmHg, the estimated right ventricular systolic pressure is normal at 21.3 mmHg.  Left Atrium: Left atrial size was normal in size.  Right Atrium: Right atrial size was normal in size  Pericardium: There is no  evidence of pericardial effusion.  Mitral Valve: The mitral valve is normal in structure. No evidence of mitral valve regurgitation. No evidence of mitral valve stenosis by observation.  Tricuspid Valve: The tricuspid valve is normal in structure. Tricuspid valve regurgitation is trivial.  Aortic Valve: The aortic valve is tricuspid. Aortic valve regurgitation is not visualized. The aortic valve is structurally normal, with no evidence of sclerosis or stenosis.  Pulmonic Valve: The pulmonic valve was normal in structure. Pulmonic valve regurgitation is trivial. Pulmonic regurgitation is trivial.  Aorta: The aortic root, ascending aorta and aortic arch are all structurally normal, with no evidence of dilitation or obstruction.  Venous: The inferior vena cava is normal in size with greater than 50% respiratory variability, suggesting right atrial pressure of 3 mmHg.  IAS/Shunts: No atrial level shunt detected by color flow Doppler. There is no evidence of a patent foramen ovale. No ventricular septal defect is seen or detected. There is no evidence of an atrial septal defect.   LEFT VENTRICLE PLAX 2D LVIDd:         4.30 cm  Diastology LVIDs:         2.70 cm  LV e' lateral:   9.68 cm/s LV PW:         1.10 cm  LV E/e' lateral: 8.0 LV IVS:        1.40 cm  LV e' medial:    6.96 cm/s LVOT diam:     1.95 cm  LV E/e' medial:  11.1 LV SV:         56 ml LV SV Index:   25.99 LVOT Area:     2.99 cm   RIGHT VENTRICLE RV Basal diam:  2.00 cm RV S prime:     14.60 cm/s TAPSE (M-mode): 2.4 cm  LEFT ATRIUM             Index       RIGHT ATRIUM  Index LA diam:        3.20 cm 1.54 cm/m  RA Area:     12.40 cm LA Vol (A2C):   39.3 ml 18.91 ml/m RA Volume:   27.60 ml  13.28 ml/m LA Vol (A4C):   28.9 ml 13.91 ml/m LA Biplane Vol: 34.4 ml 16.55 ml/m AORTIC VALVE LVOT Vmax:   138.00 cm/s LVOT Vmean:  82.000 cm/s LVOT VTI:    0.247 m  AORTA Ao Root diam: 3.10 cm  MITRAL VALVE                         TRICUSPID VALVE MV Area (PHT): 3.12 cm             TR Peak grad:   18.3 mmHg MV PHT:        70.47 msec           TR Vmax:        214.00 cm/s MV Decel Time: 243 msec MV E velocity: 77.60 cm/s 103 cm/s  SHUNTS MV A velocity: 64.30 cm/s 70.3 cm/s Systemic VTI:  0.25 m MV E/A ratio:  1.21       1.5       Systemic Diam: 1.95 cm   Chilton Si MD Electronically signed by Chilton Si MD Signature Date/Time: 07/11/2019/11:09:11 AM    Final    MONITORS  CARDIAC EVENT MONITOR 11/26/2018  Narrative  Sinus rhythm.  No significant arrhythmias           Assessment & Plan    1.  Paroxysmal AF: -Patient is currently on rate control with carvedilol 12.5 mg twice daily -He is currently on ASA 81 mg for CHA2DS2-VASc of 1.  2.  Essential hypertension: -Patient's blood pressure today was*** -Continue***  3.  Hyperlipidemia: -Patient's last LDL cholesterol was*** -Continue***  4.  Mitral valve prolapse: -2D echo was completed 2021 showing no significant valvular disease.      Disposition: Follow-up with Kristeen Miss, MD or APP in *** months {Are you ordering a CV Procedure (e.g. stress test, cath, DCCV, TEE, etc)?   Press F2        :[id number]}   Medication Adjustments/Labs and Tests Ordered: Current medicines are reviewed at length with the patient today.  Concerns regarding medicines are outlined above.   Signed, Napoleon Form, Leodis Rains, NP 10/26/2022, 8:41 AM  [facility]  Note:  This document was prepared using Dragon voice recognition software and may include unintentional dictation errors.

## 2022-10-27 ENCOUNTER — Ambulatory Visit: Payer: Self-pay | Admitting: Nurse Practitioner

## 2022-10-27 DIAGNOSIS — I10 Essential (primary) hypertension: Secondary | ICD-10-CM

## 2022-10-27 DIAGNOSIS — I480 Paroxysmal atrial fibrillation: Secondary | ICD-10-CM

## 2022-10-27 DIAGNOSIS — I341 Nonrheumatic mitral (valve) prolapse: Secondary | ICD-10-CM

## 2022-10-27 DIAGNOSIS — E782 Mixed hyperlipidemia: Secondary | ICD-10-CM

## 2022-10-31 ENCOUNTER — Encounter: Payer: Self-pay | Admitting: Cardiovascular Disease

## 2022-10-31 NOTE — Progress Notes (Unsigned)
Date:  10/31/2022   ID:  Jon Collins, DOB 11-Mar-1971, MRN [id number]  Patient Location: Home Provider Location: Home  PCP:  [name], MD  Cardiologist:  Kristeen Miss, MD  Electrophysiologist:  None   Problem List 1. Paroxysmal atrial fibrillation 2.  Essential HTN    October 25, 2018:  Jon Collins is a 52 y.o. male with a history of paroxysmal atrial fibrillation and hypertension.  He also has a history of obstructive sleep apnea.  He is not on anticoagulation.  He presents today for further evaluation of increasing palpitations.   He has been to the emergency room twice this month already.  He presented on April 5 for further evaluation of palpitations.  He returned on April 12 with abdominal pain and symptoms consistent with ileus.   He has had more palpitations. Woke up with more palpitatons.   lke a puase  May last several minutes.    Was placed on Coreg along with Dilt He was previously taking coreg only 1 a day  Now is on 18.75 mg PO BID  Potassium was found to be low - kdur was increased   Has a event monitor   No cough, fever, no aches    Evaluation Performed:  Follow-Up Visit  Chief Complaint:  palpitations  Aug. 20, 2020 - vrtual visit    Jon Collins is a 52 y.o. male with PAF .  No recent episodes of palpitations.  No cp or dyspnea.  Was in the ER Nov 24, 2018 for abdominal pain  Work up was negative     .Sept 2, 2021 Jon Collins is seen today for follow up visit for his PAF and HTN  Jon Collins is seen today for follow-up visit. Has gained some weight . Working  on that Has had Both covid vaccines.   Nov. 21, 2022 Jon Collins is seen today for his PAF, HTN Works as a Technical sales engineer  On ASA only , CHADS2VASC is 1 ( HTN)    November 01, 2022 Jon Collins is seen for follow up of his PAF, HTN, CHADS2VASC is 1 ( HTN)    Past Medical History:  Diagnosis Date   Anxiety    Decreased testosterone level 11/07/2017   Diverticulitis  of large intestine without perforation or abscess with bleeding 09/30/2014   HTN (hypertension)    Hyperlipidemia 04/14/2015   Lone atrial fibrillation    a. Dx 04/2012 in ER. Event monitor 07/2012 for palpitations - pt triggers correlated with NSR only.   MVP (mitral valve prolapse)    a. Pt reported dx at age 66, NOT seen on echo. 2D echo 05/2012: EF 55-60%, no RWMA, grade 2 diastolic dysfunction, no significant valvular abnormalities.   Reduced libido 11/09/2017   Sleep apnea    Past Surgical History:  Procedure Laterality Date   NO PAST SURGERIES       No outpatient medications have been marked as taking for the 11/01/22 encounter (Office Visit) with Liliana Brentlinger, Deloris Ping, MD.     Allergies:   Amoxicillin   Social History   Tobacco Use   Smoking status: Never   Smokeless tobacco: Never  Vaping Use   Vaping Use: Never used  Substance Use Topics   Alcohol use: No    Alcohol/week: 0.0 standard drinks of alcohol   Drug use: No     Family Hx: The patient's family history includes Hyperlipidemia in his father; Stroke in his maternal grandmother. There is no history of Heart attack.  ROS:  Please see the history of present illness.     All other systems reviewed and are negative.   Prior CV studies:   The following studies were reviewed today:    Labs/Other Tests and Data Reviewed:     Recent Labs: No results found for requested labs within last 365 days.   Recent Lipid Panel Lab Results  Component Value Date/Time   CHOL 173 05/31/2021 02:00 PM   TRIG 182 (H) 05/31/2021 02:00 PM   HDL 42 05/31/2021 02:00 PM   CHOLHDL 4.1 05/31/2021 02:00 PM   CHOLHDL 4.3 12/01/2015 08:56 AM   LDLCALC 99 05/31/2021 02:00 PM   LDLDIRECT 176.4 02/19/2013 11:43 AM    Wt Readings from Last 3 Encounters:  05/31/21 228 lb 9.6 oz (103.7 kg)  03/12/20 230 lb 12.8 oz (104.7 kg)  08/21/19 222 lb (100.7 kg)     Objective:       Physical Exam: There were no vitals taken for this  visit.  No BP recorded.  {Refresh Note OR Click here to enter BP  :1}***    GEN:  Well nourished, well developed in no acute distress HEENT: Normal NECK: No JVD; No carotid bruits LYMPHATICS: No lymphadenopathy CARDIAC: RRR ***, no murmurs, rubs, gallops RESPIRATORY:  Clear to auscultation without rales, wheezing or rhonchi  ABDOMEN: Soft, non-tender, non-distended MUSCULOSKELETAL:  No edema; No deformity  SKIN: Warm and dry NEUROLOGIC:  Alert and oriented x 3   ECG:    ASSESSMENT & PLAN:     Paroxysmal atrial fibrillation:.       2.  Hypertension:     3.  hyperlipidema -           Medication Adjustments/Labs and Tests Ordered: Current medicines are reviewed at length with the patient today.  Concerns regarding medicines are outlined above.   Tests Ordered: No orders of the defined types were placed in this encounter.    Medication Changes: No orders of the defined types were placed in this encounter.    Follow Up: in 1 year   Signed, Kristeen Miss, MD  10/31/2022 9:01 PM    [facility]

## 2022-11-01 ENCOUNTER — Encounter: Payer: Self-pay | Admitting: Cardiovascular Disease

## 2022-12-21 DIAGNOSIS — R7303 Prediabetes: Secondary | ICD-10-CM | POA: Diagnosis not present

## 2022-12-27 ENCOUNTER — Ambulatory Visit: Payer: Self-pay | Admitting: Physician Assistant

## 2022-12-28 ENCOUNTER — Ambulatory Visit: Payer: Self-pay | Admitting: Physician Assistant

## 2023-01-03 ENCOUNTER — Ambulatory Visit: Payer: Self-pay | Admitting: Physician Assistant

## 2023-01-17 ENCOUNTER — Ambulatory Visit: Payer: BC Managed Care – PPO | Admitting: Physician Assistant

## 2023-01-24 ENCOUNTER — Ambulatory Visit: Payer: BC Managed Care – PPO | Attending: Cardiovascular Disease | Admitting: Cardiovascular Disease

## 2023-01-24 ENCOUNTER — Encounter: Payer: Self-pay | Admitting: Cardiovascular Disease

## 2023-01-31 ENCOUNTER — Ambulatory Visit: Payer: BC Managed Care – PPO | Admitting: Physician Assistant

## 2023-02-09 ENCOUNTER — Other Ambulatory Visit (INDEPENDENT_AMBULATORY_CARE_PROVIDER_SITE_OTHER): Payer: BC Managed Care – PPO

## 2023-02-09 ENCOUNTER — Encounter: Payer: Self-pay | Admitting: Physician Assistant

## 2023-02-09 ENCOUNTER — Ambulatory Visit (INDEPENDENT_AMBULATORY_CARE_PROVIDER_SITE_OTHER): Payer: BC Managed Care – PPO | Admitting: Physician Assistant

## 2023-02-09 DIAGNOSIS — M170 Bilateral primary osteoarthritis of knee: Secondary | ICD-10-CM

## 2023-02-09 NOTE — Progress Notes (Signed)
Office Visit Note   Patient: Jon Collins           Date of Birth: 1971-02-23           MRN: [id number] Visit Date: 02/09/2023              Requested by: [name], MD [address] [place],  [place] [address] PCP: [name], MD   Assessment & Plan: Visit Diagnoses:  1. Bilateral primary osteoarthritis of knee     Plan: Impression is bilateral knee osteoarthritis right greater than left.  Today, we had a discussion about various treatment options to include NSAIDs versus cortisone injection versus viscosupplementation injection.  He would like to try cortisone injections today.  We have provided him with a [employer].  He will follow-up with [name] as needed.  Call with concerns or questions in the meantime.  Follow-Up Instructions: Return if symptoms worsen or fail to improve.   Orders:  Orders Placed This Encounter  Procedures   Large Joint Inj   XR KNEE 3 VIEW LEFT   XR KNEE 3 VIEW RIGHT   No orders of the defined types were placed in this encounter.     Procedures: Large Joint Inj: bilateral knee on 02/09/2023 10:20 AM Indications: pain Details: 22 G needle, anterolateral approach      Clinical Data: No additional findings.   Subjective: Chief Complaint  Patient presents with   Right Knee - Pain   Left Knee - Pain    HPI patient is a 52 year old gentleman who comes in today with bilateral knee pain right greater than left for the past 3 to 4 months without any injury or change in activity.  The pain he has is primarily to the anterior aspect.  Symptoms are worse with squatting, pivoting and going from a seated to standing position.  He has occasional popping and swelling.  He has been taking NSAIDs with some relief.  No previous cortisone injection to either knee.  Review of Systems as detailed in HPI.  All others reviewed and are negative.   Objective: Vital Signs: There were no vitals taken for this  visit.  Physical Exam well-developed well-nourished gentleman in no acute distress.  Alert and oriented x 3.  Ortho Exam bilateral knee exam: Small effusion.  Range of motion 0 to 125 degrees.  Mild patellofemoral crepitus.  No joint line tenderness on the left.  Slight medial joint line tenderness on the right.  Ligaments are stable.  He is neurovascularly intact distally.  Specialty Comments:  No specialty comments available.  Imaging: XR KNEE 3 VIEW LEFT  Result Date: 02/09/2023 Moderate medial and patellofemoral degenerative changes  XR KNEE 3 VIEW RIGHT  Result Date: 02/09/2023 Moderate medial and patellofemoral degenerative changes    PMFS History: Patient Active Problem List   Diagnosis Date Noted   Reduced libido 11/09/2017   Decreased testosterone level 11/07/2017   Mitral valve prolapse 01/14/2016   Hyperlipidemia 04/14/2015   Diverticulitis of large intestine without perforation or abscess with bleeding 09/30/2014   Palpitations suggestive of PVCs or PACs 10/16/2013   Sleep apnea 10/16/2013   A-fib (HCC) 05/15/2012   HTN (hypertension) 05/15/2012   Past Medical History:  Diagnosis Date   Anxiety    Decreased testosterone level 11/07/2017   Diverticulitis of large intestine without perforation or abscess with bleeding 09/30/2014   HTN (hypertension)    Hyperlipidemia 04/14/2015   Lone atrial fibrillation (HCC)  a. Dx 04/2012 in ER. Event monitor 07/2012 for palpitations - pt triggers correlated with NSR only.   MVP (mitral valve prolapse)    a. Pt reported dx at age 68, NOT seen on echo. 2D echo 05/2012: EF 55-60%, no RWMA, grade 2 diastolic dysfunction, no significant valvular abnormalities.   Reduced libido 11/09/2017   Sleep apnea     Family History  Problem Relation Age of Onset   Hyperlipidemia Father    Stroke Maternal Grandmother    Heart attack Neg Hx     Past Surgical History:  Procedure Laterality Date   NO PAST SURGERIES     Social History    Occupational History   Not on file  Tobacco Use   Smoking status: Never   Smokeless tobacco: Never  Vaping Use   Vaping status: Never Used  Substance and Sexual Activity   Alcohol use: No    Alcohol/week: 0.0 standard drinks of alcohol   Drug use: No   Sexual activity: Not on file

## 2023-03-02 ENCOUNTER — Encounter: Payer: Self-pay | Admitting: Cardiovascular Disease

## 2023-03-02 NOTE — Progress Notes (Signed)
No show  This encounter was created in error - please disregard.

## 2023-03-03 ENCOUNTER — Ambulatory Visit: Payer: BC Managed Care – PPO | Attending: Cardiovascular Disease | Admitting: Cardiovascular Disease

## 2023-03-21 ENCOUNTER — Ambulatory Visit: Payer: BC Managed Care – PPO | Admitting: Physician Assistant

## 2023-03-24 ENCOUNTER — Telehealth: Payer: Self-pay

## 2023-03-24 ENCOUNTER — Other Ambulatory Visit: Payer: Self-pay

## 2023-03-24 ENCOUNTER — Other Ambulatory Visit (INDEPENDENT_AMBULATORY_CARE_PROVIDER_SITE_OTHER): Payer: BC Managed Care – PPO

## 2023-03-24 ENCOUNTER — Ambulatory Visit (INDEPENDENT_AMBULATORY_CARE_PROVIDER_SITE_OTHER): Payer: BC Managed Care – PPO | Admitting: Physician Assistant

## 2023-03-24 DIAGNOSIS — M25512 Pain in left shoulder: Secondary | ICD-10-CM | POA: Diagnosis not present

## 2023-03-24 DIAGNOSIS — G8929 Other chronic pain: Secondary | ICD-10-CM

## 2023-03-24 MED ORDER — PREDNISONE 10 MG (21) PO TBPK: ORAL_TABLET | ORAL | 0 refills | Status: DC

## 2023-03-24 MED ORDER — TRAMADOL HCL 50 MG PO TABS
50.0000 mg | ORAL_TABLET | Freq: Four times a day (QID) | ORAL | 0 refills | Status: DC | PRN
Start: 2023-03-24 — End: 2023-05-11

## 2023-03-24 MED ORDER — METHOCARBAMOL 750 MG PO TABS: 750.0000 mg | ORAL_TABLET | Freq: Two times a day (BID) | ORAL | 2 refills | Status: DC | PRN

## 2023-03-24 NOTE — Telephone Encounter (Signed)
Please precert for bilateral visco injections. Dr.Xu's patient. Thanks!

## 2023-03-24 NOTE — Progress Notes (Signed)
Office Visit Note   Patient: Jon Collins           Date of Birth: 1971/03/18           MRN: [id number] Visit Date: 03/24/2023              Requested by: [name], MD [address] [place],  [place] [address] PCP: [name], MD   Assessment & Plan: Visit Diagnoses:  1. Chronic left shoulder pain     Plan: Impression is chronic left shoulder/arm pain with left upper extremity paresthesias.  Symptoms have been ongoing for many years and if not improved with recent physical therapy for the past several months as well as with prescription anti-inflammatories.  I would like to go ahead and obtain both MRIs of the left shoulder and cervical spine to further evaluate for structural abnormalities.  He will follow-up with [name] once these have been completed.  Call with concerns or questions.  Follow-Up Instructions: Return for f/u once approved for visco inj and for mri review.   Orders:  Orders Placed This Encounter  Procedures   XR Shoulder Left   XR Cervical Spine 2 or 3 views   MR Cervical Spine w/o contrast   MR Shoulder Left w/o contrast   Meds ordered this encounter  Medications   predniSONE (STERAPRED UNI-PAK 21 TAB) 10 MG (21) TBPK tablet    Sig: Take as directed    Dispense:  21 tablet    Refill:  0   methocarbamol (ROBAXIN-750) 750 MG tablet    Sig: Take 1 tablet (750 mg total) by mouth 2 (two) times daily as needed for muscle spasms.    Dispense:  20 tablet    Refill:  2   traMADol (ULTRAM) 50 MG tablet    Sig: Take 1 tablet (50 mg total) by mouth every 6 (six) hours as needed.    Dispense:  30 tablet    Refill:  0      Procedures: No procedures performed   Clinical Data: No additional findings.   Subjective: Chief Complaint  Patient presents with   Left Shoulder - Pain    HPI patient is a very pleasant 52 year old gentleman who comes in today with left shoulder and arm pain.  Symptoms have been ongoing for  years and have progressively worsened.  He denies any injury or change in activities.  The pain he has is primarily to the lateral neck and radiates down to the deltoid.  He also has paresthesias that go down the entire arm and all 5 fingers.  He has increased pain with abduction of the shoulder as well as with neck flexion.  No previous cortisone injection to the shoulder or neck.  He has been to physical therapy for several months for both the shoulder and neck without relief.  He has tried prescription diclofenac without relief.  Review of Systems as detailed in HPI.  All others reviewed and are negative.   Objective: Vital Signs: There were no vitals taken for this visit.  Physical Exam well-developed and well-nourished male in no acute distress.  Alert and oriented x 3.  Ortho Exam cervical spine exam shows no spinous or paraspinous tenderness.  He has increased pain with flexion.  Shoulder exam: Full forward flexion.  Abduction to 90 degrees.  Positive empty can, speeds and O'Brien's testing.  He has pain to the [facility] joint.  Full strength throughout.  He is neurovascularly intact distally.  Specialty Comments:  No specialty comments available.  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 Dx 04/2012 in ER. Event monitor 07/2012 for palpitations - pt triggers correlated with NSR only.   MVP (mitral valve prolapse)    a. Pt reported dx at age 52, NOT seen on echo. 2D echo 05/2012: EF 55-60%, no RWMA, grade 2 diastolic dysfunction, no significant valvular abnormalities.   Reduced libido 11/09/2017   Sleep apnea     Family History  Problem Relation Age of Onset   Hyperlipidemia Father    Stroke Maternal Grandmother    Heart attack Neg Hx     Past Surgical History:  Procedure Laterality Date   NO PAST SURGERIES     Social History   Occupational History   Not on file  Tobacco Use   Smoking status: Never   Smokeless tobacco: Never  Vaping Use   Vaping status: Never Used  Substance and Sexual Activity   Alcohol use: No    Alcohol/week: 0.0 standard drinks of alcohol   Drug use: No   Sexual activity: Not on file

## 2023-03-27 DIAGNOSIS — E7800 Pure hypercholesterolemia, unspecified: Secondary | ICD-10-CM | POA: Diagnosis not present

## 2023-03-27 DIAGNOSIS — Z125 Encounter for screening for malignant neoplasm of prostate: Secondary | ICD-10-CM | POA: Diagnosis not present

## 2023-03-27 DIAGNOSIS — I10 Essential (primary) hypertension: Secondary | ICD-10-CM | POA: Diagnosis not present

## 2023-03-27 DIAGNOSIS — R7303 Prediabetes: Secondary | ICD-10-CM | POA: Diagnosis not present

## 2023-03-28 NOTE — Telephone Encounter (Signed)
VOB submitted for Orthovisc, bilateral knee

## 2023-04-06 ENCOUNTER — Telehealth: Payer: Self-pay

## 2023-04-06 NOTE — Telephone Encounter (Signed)
Talked with [facility] and was advised that Orthovisc is not a preferred product with patients insurance.   VOB submitted for Durolane, bilateral knee  PA has been approved -[id number] Valid 04/06/2023- 10/04/2023

## 2023-04-07 ENCOUNTER — Ambulatory Visit: Payer: BC Managed Care – PPO | Admitting: Physician Assistant

## 2023-04-07 ENCOUNTER — Other Ambulatory Visit: Payer: Self-pay

## 2023-04-07 DIAGNOSIS — M170 Bilateral primary osteoarthritis of knee: Secondary | ICD-10-CM

## 2023-04-14 ENCOUNTER — Telehealth: Payer: Self-pay | Admitting: Physician Assistant

## 2023-04-14 NOTE — Telephone Encounter (Signed)
Called and spoke with patient.

## 2023-04-14 NOTE — Telephone Encounter (Signed)
I would not recommend knee mri at this time as his symptoms are likely from underlying oa

## 2023-04-14 NOTE — Telephone Encounter (Signed)
Patient called and said that in the referral for the MRI can he have his right knee that have inflammation and pain. CB#[phone]

## 2023-04-21 ENCOUNTER — Ambulatory Visit: Payer: BC Managed Care – PPO | Admitting: Physician Assistant

## 2023-04-25 ENCOUNTER — Ambulatory Visit: Payer: BC Managed Care – PPO | Admitting: Physician Assistant

## 2023-04-26 ENCOUNTER — Ambulatory Visit: Payer: BC Managed Care – PPO | Admitting: Orthopaedic Surgery

## 2023-05-01 ENCOUNTER — Telehealth: Payer: Self-pay | Admitting: Radiology

## 2023-05-01 NOTE — Telephone Encounter (Signed)
Can you please reschedule patients appointment with Dr. Roda Shutters tomorrow? We are out of stock of the gel injection he was approved for & it won't be in until Thursday or Friday.  Thank you!

## 2023-05-02 ENCOUNTER — Ambulatory Visit: Payer: BC Managed Care – PPO | Admitting: Orthopaedic Surgery

## 2023-05-08 DIAGNOSIS — R140 Abdominal distension (gaseous): Secondary | ICD-10-CM | POA: Diagnosis not present

## 2023-05-08 DIAGNOSIS — Z860100 Personal history of colon polyps, unspecified: Secondary | ICD-10-CM | POA: Diagnosis not present

## 2023-05-08 DIAGNOSIS — K5900 Constipation, unspecified: Secondary | ICD-10-CM | POA: Diagnosis not present

## 2023-05-09 ENCOUNTER — Encounter: Payer: Self-pay | Admitting: Orthopaedic Surgery

## 2023-05-09 ENCOUNTER — Ambulatory Visit (INDEPENDENT_AMBULATORY_CARE_PROVIDER_SITE_OTHER): Payer: BC Managed Care – PPO | Admitting: Orthopaedic Surgery

## 2023-05-09 DIAGNOSIS — M170 Bilateral primary osteoarthritis of knee: Secondary | ICD-10-CM | POA: Diagnosis not present

## 2023-05-09 MED ORDER — SODIUM HYALURONATE 60 MG/3ML IX PRSY
60.0000 mg | PREFILLED_SYRINGE | INTRA_ARTICULAR | Status: AC | PRN
Start: 2023-05-09 — End: 2023-05-09
  Administered 2023-05-09: 60 mg via INTRA_ARTICULAR

## 2023-05-09 NOTE — Progress Notes (Signed)
   Procedure Note  Patient: Jon Collins             Date of Birth: 1970/08/24           MRN: [id number]             Visit Date: 05/09/2023  Procedures: Visit Diagnoses:  1. Bilateral primary osteoarthritis of knee    Patient underwent bilateral Durolane injections.  Large Joint Inj: bilateral knee on 05/09/2023 4:02 PM Indications: pain Details: 22 G needle  Arthrogram: No  Medications (Right): 60 mg Sodium Hyaluronate 60 MG/3ML Medications (Left): 60 mg Sodium Hyaluronate 60 MG/3ML Outcome: tolerated well, no immediate complications Patient was prepped and draped in the usual sterile fashion.

## 2023-05-10 ENCOUNTER — Other Ambulatory Visit: Payer: BC Managed Care – PPO

## 2023-05-10 ENCOUNTER — Telehealth: Payer: Self-pay | Admitting: Orthopaedic Surgery

## 2023-05-10 NOTE — Telephone Encounter (Signed)
Notified patient.

## 2023-05-10 NOTE — Telephone Encounter (Signed)
Patient called asked if he can get something called into his pharmacy for pain and inflammation. Patient uses [facility] in [place]  The number to contact patient is       [phone]

## 2023-05-10 NOTE — Telephone Encounter (Signed)
Patient called and ask for a right knee brace. CB#[phone]

## 2023-05-10 NOTE — Telephone Encounter (Signed)
Just let him know that insurance won't pay for it.  I recommend getting the modvel knee compression sleeve from [place].

## 2023-05-11 ENCOUNTER — Other Ambulatory Visit: Payer: Self-pay | Admitting: Physician Assistant

## 2023-05-11 MED ORDER — DICLOFENAC SODIUM 75 MG PO TBEC: 75.0000 mg | DELAYED_RELEASE_TABLET | Freq: Two times a day (BID) | ORAL | 2 refills | Status: DC | PRN

## 2023-05-11 MED ORDER — TRAMADOL HCL 50 MG PO TABS: 50.0000 mg | ORAL_TABLET | Freq: Two times a day (BID) | ORAL | 0 refills | Status: DC | PRN

## 2023-05-11 NOTE — Telephone Encounter (Signed)
Sent in tramadol and diclofenac

## 2023-05-11 NOTE — Telephone Encounter (Signed)
Notified patient.

## 2023-05-28 ENCOUNTER — Ambulatory Visit
Admission: RE | Admit: 2023-05-28 | Discharge: 2023-05-28 | Disposition: A | Discharge status: Home / Self Care | Payer: BC Managed Care – PPO | Source: Ambulatory Visit | Attending: Physician Assistant | Admitting: Physician Assistant

## 2023-05-28 DIAGNOSIS — G8929 Other chronic pain: Secondary | ICD-10-CM

## 2023-05-28 DIAGNOSIS — M75102 Unspecified rotator cuff tear or rupture of left shoulder, not specified as traumatic: Secondary | ICD-10-CM | POA: Diagnosis not present

## 2023-05-28 DIAGNOSIS — M25512 Pain in left shoulder: Secondary | ICD-10-CM | POA: Diagnosis not present

## 2023-05-28 DIAGNOSIS — M19012 Primary osteoarthritis, left shoulder: Secondary | ICD-10-CM | POA: Diagnosis not present

## 2023-05-28 DIAGNOSIS — M542 Cervicalgia: Secondary | ICD-10-CM | POA: Diagnosis not present

## 2023-06-12 NOTE — Progress Notes (Signed)
F/u with xu to discuss

## 2023-06-14 ENCOUNTER — Telehealth: Payer: Self-pay | Admitting: Orthopaedic Surgery

## 2023-06-14 ENCOUNTER — Telehealth: Payer: Self-pay | Admitting: *Deleted

## 2023-06-14 ENCOUNTER — Encounter: Payer: Self-pay | Admitting: Orthopaedic Surgery

## 2023-06-14 ENCOUNTER — Ambulatory Visit (INDEPENDENT_AMBULATORY_CARE_PROVIDER_SITE_OTHER): Payer: BC Managed Care – PPO | Admitting: Orthopaedic Surgery

## 2023-06-14 DIAGNOSIS — M75122 Complete rotator cuff tear or rupture of left shoulder, not specified as traumatic: Secondary | ICD-10-CM | POA: Diagnosis not present

## 2023-06-14 DIAGNOSIS — M7542 Impingement syndrome of left shoulder: Secondary | ICD-10-CM | POA: Diagnosis not present

## 2023-06-14 DIAGNOSIS — M19012 Primary osteoarthritis, left shoulder: Secondary | ICD-10-CM

## 2023-06-14 NOTE — Telephone Encounter (Signed)
   Name: Jon Collins  DOB: 03/17/1971  MRN: [id number]  Primary Cardiologist: Kristeen Miss, MD  Chart reviewed as part of pre-operative protocol coverage. It does not look like patient has been seen in our office since 2022. Because of Jon Collins past medical history and time since last visit, he will require a follow-up in-office visit in order to better assess preoperative cardiovascular risk. He already has a visit scheduled with Dr. Melburn Popper on 07/26/2023. Therefore, pre-op risk assessment can be addressed at that time.   Pre-op covering staff: - Please schedule appointment and call patient to inform them. If patient already had an upcoming appointment within acceptable timeframe, please add "pre-op clearance" to the appointment notes so provider is aware. - Please contact requesting surgeon's office via preferred method (i.e, phone, fax) to inform them of need for appointment prior to surgery.     Corrin Parker, PA-C  06/14/2023, 2:23 PM

## 2023-06-14 NOTE — Telephone Encounter (Signed)
   Pre-operative Risk Assessment    Patient Name: Jon Collins  DOB: 1971/01/18 MRN: [id number]  DATE OF LAST VISIT: 07/26/22 Jari Favre, PAC DATE OF NEXT VISIT: 07/26/23 DR. NAHSER    Request for Surgical Clearance    Procedure:   RIGHT SHOULDER ARTHROSCOPY  Date of Surgery:  Clearance TBD                                 Surgeon:  DR. Cheral Almas Surgeon's Group or Practice Name:  [facility] AT [facility] Phone number:  [phone] Fax number:  [phone]   Type of Clearance Requested:   - Medical  - Pharmacy:  Hold Aspirin     Type of Anesthesia:  General    Additional requests/questions:    SignedDanielle Rankin   06/14/2023, 2:10 PM

## 2023-06-14 NOTE — Telephone Encounter (Signed)
Yes let's have him see Aundra Millet

## 2023-06-14 NOTE — Telephone Encounter (Signed)
Will update the requesting office the pt has appt with Dr. Elease Hashimoto 07/26/23, per pre op APP clearance will be addressed at that appt.

## 2023-06-14 NOTE — Telephone Encounter (Signed)
Pt called in requesting injection for his neck pain please advise said he mentioned it to [facility] but did not go into detail would he need to see [place]/Megan?

## 2023-06-14 NOTE — Progress Notes (Signed)
Office Visit Note   Patient: Jon Collins           Date of Birth: 24-Nov-1970           MRN: [id number] Visit Date: 06/14/2023              Requested by: [name], MD [address] [place],  [place] [address] PCP: [name], MD   Assessment & Plan: Visit Diagnoses:  1. Nontraumatic complete tear of left rotator cuff   2. Arthritis of left acromioclavicular joint   3. Impingement syndrome of left shoulder     Plan: MRI of the left shoulder shows a nearly full-thickness tear of the supraspinatus and severe bulky AC arthritis and unfavorable acromial anatomy.  MRI of the cervical spine shows multilevel degenerative disc disease with foraminal stenosis.  Based on his presentation I think the shoulder is the main problem.  I have recommended arthroscopic rotator cuff repair, distal clavicle excision, subacromial decompression, extensive debridement.  Risk benefits prognosis reviewed with the patient.  Will get preoperative clearance from Dr. Lourena Simmonds.  Eunice Blase will call the patient to confirm surgery date once we have the necessary clearances.  Follow-Up Instructions: No follow-ups on file.   Orders:  No orders of the defined types were placed in this encounter.  No orders of the defined types were placed in this encounter.     Procedures: No procedures performed   Clinical Data: No additional findings.   Subjective: Chief Complaint  Patient presents with   Left Shoulder - Pain   Neck - Pain    HPI Patient returns today to discuss MRI scans. Review of Systems  Constitutional: Negative.   HENT: Negative.    Eyes: Negative.   Respiratory: Negative.    Cardiovascular: Negative.   Gastrointestinal: Negative.   Endocrine: Negative.   Genitourinary: Negative.   Skin: Negative.   Allergic/Immunologic: Negative.   Neurological: Negative.   Hematological: Negative.   Psychiatric/Behavioral: Negative.    All other systems  reviewed and are negative.    Objective: Vital Signs: There were no vitals taken for this visit.  Physical Exam Vitals and nursing note reviewed.  Constitutional:      Appearance: He is well-developed.  Pulmonary:     Effort: Pulmonary effort is normal.  Abdominal:     Palpations: Abdomen is soft.  Skin:    General: Skin is warm.  Neurological:     Mental Status: He is alert and oriented to person, place, and time.  Psychiatric:        Behavior: Behavior normal.        Thought Content: Thought content normal.        Judgment: Judgment normal.     Ortho Exam Examination of the left shoulder shows pain with Hawkins and Neer impingement.  Pain with palpation of the [facility] joint and cross body adduction.  Pain and slight weakness to empty can test. Specialty Comments:  No specialty comments available.  Imaging: No results found.   PMFS History: Patient Active Problem List   Diagnosis Date Noted   Reduced libido 11/09/2017   Decreased testosterone level 11/07/2017   Mitral valve prolapse 01/14/2016   Hyperlipidemia 04/14/2015   Diverticulitis of large intestine without perforation or abscess with bleeding 09/30/2014   Palpitations suggestive of PVCs or PACs 10/16/2013   Sleep apnea 10/16/2013   A-fib (HCC) 05/15/2012   HTN (hypertension) 05/15/2012   Past Medical History:  Diagnosis Date  Anxiety    Decreased testosterone level 11/07/2017   Diverticulitis of large intestine without perforation or abscess with bleeding 09/30/2014   HTN (hypertension)    Hyperlipidemia 04/14/2015   Lone atrial fibrillation (HCC)    a. Dx 04/2012 in ER. Event monitor 07/2012 for palpitations - pt triggers correlated with NSR only.   MVP (mitral valve prolapse)    a. Pt reported dx at age 66, NOT seen on echo. 2D echo 05/2012: EF 55-60%, no RWMA, grade 2 diastolic dysfunction, no significant valvular abnormalities.   Reduced libido 11/09/2017   Sleep apnea     Family History  Problem  Relation Age of Onset   Hyperlipidemia Father    Stroke Maternal Grandmother    Heart attack Neg Hx     Past Surgical History:  Procedure Laterality Date   NO PAST SURGERIES     Social History   Occupational History   Not on file  Tobacco Use   Smoking status: Never   Smokeless tobacco: Never  Vaping Use   Vaping status: Never Used  Substance and Sexual Activity   Alcohol use: No    Alcohol/week: 0.0 standard drinks of alcohol   Drug use: No   Sexual activity: Not on file

## 2023-06-14 NOTE — Telephone Encounter (Signed)
CORRECTION ON FAX #: [phone]

## 2023-06-19 ENCOUNTER — Ambulatory Visit: Payer: BC Managed Care – PPO | Admitting: Physical Medicine and Rehabilitation

## 2023-06-20 ENCOUNTER — Ambulatory Visit: Payer: BC Managed Care – PPO | Admitting: Orthopaedic Surgery

## 2023-06-20 ENCOUNTER — Telehealth: Payer: Self-pay | Admitting: Cardiovascular Disease

## 2023-06-20 NOTE — Telephone Encounter (Signed)
Patient called in requesting a sooner pre-op appointment due to wanting to have the procedure performed before the end of the year. An appointment was scheduled in [place] for 12/13 at 1:55 pm with Carlos Levering, NP due to it being the only location with availability.

## 2023-06-20 NOTE — Telephone Encounter (Signed)
I s/w the pt and who called to see if we had a sooner appt. I informed pt that we did not and that we have set up for 06/23/23 @ 1:55 with Jon Levering, NP in our [place] office. Pt said that is fine. He did ask for the directions to [place] office.   I did tell him it was the [facility] [address]. Though I will ask one of our schedulers if they can send him the directions through [facility] CHART per his request. Pt thanked me for the help.

## 2023-06-21 ENCOUNTER — Ambulatory Visit (INDEPENDENT_AMBULATORY_CARE_PROVIDER_SITE_OTHER): Payer: BC Managed Care – PPO | Admitting: Physical Medicine and Rehabilitation

## 2023-06-21 ENCOUNTER — Encounter: Payer: Self-pay | Admitting: Physical Medicine and Rehabilitation

## 2023-06-21 DIAGNOSIS — M25512 Pain in left shoulder: Secondary | ICD-10-CM

## 2023-06-21 DIAGNOSIS — M542 Cervicalgia: Secondary | ICD-10-CM

## 2023-06-21 DIAGNOSIS — M5412 Radiculopathy, cervical region: Secondary | ICD-10-CM

## 2023-06-21 DIAGNOSIS — G8929 Other chronic pain: Secondary | ICD-10-CM

## 2023-06-21 NOTE — Progress Notes (Signed)
He does have a shoulder tear that is causing some pain. No injury to his neck that he can recall.  Pain goes down his L arm.

## 2023-06-21 NOTE — Progress Notes (Signed)
Jon Collins - 52 y.o. male MRN [id number]  Date of birth: Mar 11, 1971  Office Visit Note: Visit Date: 06/21/2023 PCP: [name], MD Referred by: [name], MD  Subjective: Chief Complaint  Patient presents with   Neck - Pain   HPI: Jon Collins is a 52 y.o. male who comes in today per the request of Dr. Glee Arvin for evaluation of chronic, worsening and severe left sided neck pain radiating to shoulder and down to hand. Paresthesias noted to hand/fingers. Pain ongoing for over 1 year, worsens with movement and activity. He describes pain as sore and throbbing sensation, currently rates as 5 out of 10. Some relief of pain with home exercise regimen, heating pad, rest and use of medications. No history of formal physical therapy for cervical issues. Recent cervical MRI imaging exhibits multi level foraminal stenosis, severe on the left at C5-C6. No high grade spinal canal stenosis noted. Patient is currently working as Char[employer] fundraiser, he has been working in [place] for the last 30 days. He is managed by Dr. Roda Shutters from orthopedic standpoint, recent MRI of left shoulder shows high-grade partial-thickness bursal sided tear of the anterior supraspinatus tendon insertion and degenerative changes of the acromioclavicular joint. He is working with Dr. Roda Shutters to schedule left rotator cuff surgery. Patient denies focal weakness. No recent trauma or falls.        Review of Systems  Musculoskeletal:  Positive for neck pain.  Neurological:  Positive for tingling. Negative for focal weakness and weakness.  All other systems reviewed and are negative.  Otherwise per HPI.  Assessment & Plan: Visit Diagnoses:    ICD-10-CM   1. Chronic neck pain  M54.2 Ambulatory referral to Physical Medicine Rehab   G89.29     2. Radiculopathy, cervical region  M54.12 Ambulatory referral to Physical Medicine Rehab    3. Chronic left shoulder pain  M25.512 Ambulatory referral to  Physical Medicine Rehab   [phone]        Plan: Findings:  Chronic, worsening and severe left sided neck pain radiating to shoulder and down to hand. Patient continues to have severe pain despite good conservative therapies such as home exercise regimen, rest and use of medications. I discussed recent cervical MRI today using imaging and spine model. Patient's clinical presentation and exam are consistent with more C6 nerve pattern.  There is severe foraminal stenosis on the left at C5-C6. He is also experiencing lett shoulder issues and is working with Dr. Roda Shutters to schedule left rotator cuff repair. We discussed treatment plan in detail today, next step is to perform diagnostic and hopefully therapeutic left C7-T1 interlaminar epidural steroid injection under fluoroscopic guidance. He is not currently taking anticoagulants. If good relief of pain with injection we can repeat this procedure infrequently as needed. Dr. Alvester Morin at bedside to discuss injection procedure with patent. He has no questions at this time. We will see him back for injection. No red flag symptoms noted upon exam today.     Meds & Orders: No orders of the defined types were placed in this encounter.   Orders Placed This Encounter  Procedures   Ambulatory referral to Physical Medicine Rehab    Follow-up: Return for left C7-T1 interlaminar epidural steroid injection.   Procedures: No procedures performed      Clinical History: CLINICAL DATA:  pain   EXAM: MRI CERVICAL SPINE WITHOUT CONTRAST   TECHNIQUE: Multiplanar, multisequence MR imaging of the cervical spine was performed. No intravenous  contrast was administered.   COMPARISON:  None Available.   FINDINGS: Alignment: Straightening of the normal cervical lordosis   Vertebrae: No fracture, evidence of discitis, or bone lesion.   Cord: Normal signal and morphology.   Posterior Fossa, vertebral arteries, paraspinal tissues: Negative.   Disc levels:   C1-C2:  Mild degenerative change.   C2-C3: Mild bilateral facet degenerative change. No significant disc bulge. No spinal canal narrowing. Mild left neural foraminal narrowing.   C3-C4: Mild bilateral facet degenerative change. Uncovertebral hypertrophy. Minimal disc bulge. No significant spinal canal narrowing. Moderate left neural foraminal narrowing.   C4-C5: Mild bilateral facet degenerative change. Circumferential disc bulge. Uncovertebral hypertrophy. Mild spinal canal narrowing. Moderate bilateral neural foraminal narrowing, left-greater-than-right.   C5-C6: Mild bilateral facet degenerative change. Uncovertebral hypertrophy. Minimal disc bulge. Mild spinal canal narrowing. Severe left and moderate right neural foraminal narrowing.   C6-C7: Minimal disc bulge. Uncovertebral hypertrophy. Moderate left and mild right neural foraminal narrowing. No significant spinal canal narrowing.   C7-T1: No significant disc bulge. Mild bilateral facet degenerative change. No spinal canal or neural foraminal narrowing.   IMPRESSION: 1. Multilevel degenerative changes of the cervical spine with mild spinal canal narrowing at C4-C5 and C5-C6. 2. Multilevel neural foraminal narrowing, severe on the left at C5-C6 and moderate on the left at C3-C4, C4-C5, C5-C6, and C6-C7.     Electronically Signed   By: Lorenza Cambridge M.D.   On: 06/14/2023 08:41   He reports that he has never smoked. He has never used smokeless tobacco. No results for input(s): "HGBA1C", "LABURIC" in the last 8760 hours.  Objective:  VS:  HT:    WT:   BMI:     BP:   HR: bpm  TEMP: ( )  RESP:  Physical Exam Vitals and nursing note reviewed.  HENT:     Head: Normocephalic and atraumatic.     Right Ear: External ear normal.     Left Ear: External ear normal.     Nose: Nose normal.     Mouth/Throat:     Mouth: Mucous membranes are moist.  Eyes:     Extraocular Movements: Extraocular movements intact.  Cardiovascular:      Rate and Rhythm: Normal rate.     Pulses: Normal pulses.  Pulmonary:     Effort: Pulmonary effort is normal.  Abdominal:     General: Abdomen is flat. There is no distension.  Musculoskeletal:        General: Tenderness present.     Cervical back: Tenderness present.     Comments: No discomfort noted with flexion, extension and side-to-side rotation. Patient has good strength in the upper extremities including 5 out of 5 strength in wrist extension, long finger flexion and APB. Shoulder range of motion is full bilaterally without any sign of impingement. There is no atrophy of the hands intrinsically. Sensation intact bilaterally. Dysesthesias noted to left C6 dermatome. Negative Hoffman's sign. Negative Spurling's sign.     Skin:    General: Skin is warm and dry.     Capillary Refill: Capillary refill takes less than 2 seconds.  Neurological:     General: No focal deficit present.     Mental Status: He is alert and oriented to person, place, and time.  Psychiatric:        Mood and Affect: Mood normal.        Behavior: Behavior normal.     Ortho Exam  Imaging: No results found.  Past Medical/Family/Surgical/Social History: Medications &  Allergies reviewed per EMR, new medications updated. Patient Active Problem List   Diagnosis Date Noted   Reduced libido 11/09/2017   Decreased testosterone level 11/07/2017   Mitral valve prolapse 01/14/2016   Hyperlipidemia 04/14/2015   Diverticulitis of large intestine without perforation or abscess with bleeding 09/30/2014   Palpitations suggestive of PVCs or PACs 10/16/2013   Sleep apnea 10/16/2013   A-fib (HCC) 05/15/2012   HTN (hypertension) 05/15/2012   Past Medical History:  Diagnosis Date   Anxiety    Decreased testosterone level 11/07/2017   Diverticulitis of large intestine without perforation or abscess with bleeding 09/30/2014   HTN (hypertension)    Hyperlipidemia 04/14/2015   Lone atrial fibrillation (HCC)    a. Dx  04/2012 in ER. Event monitor 07/2012 for palpitations - pt triggers correlated with NSR only.   MVP (mitral valve prolapse)    a. Pt reported dx at age 22, NOT seen on echo. 2D echo 05/2012: EF 55-60%, no RWMA, grade 2 diastolic dysfunction, no significant valvular abnormalities.   Reduced libido 11/09/2017   Sleep apnea    Family History  Problem Relation Age of Onset   Hyperlipidemia Father    Stroke Maternal Grandmother    Heart attack Neg Hx    Past Surgical History:  Procedure Laterality Date   NO PAST SURGERIES     Social History   Occupational History   Not on file  Tobacco Use   Smoking status: Never   Smokeless tobacco: Never  Vaping Use   Vaping status: Never Used  Substance and Sexual Activity   Alcohol use: No    Alcohol/week: 0.0 standard drinks of alcohol   Drug use: No   Sexual activity: Not on file

## 2023-06-22 NOTE — Progress Notes (Unsigned)
Cardiology Clinic Note   Date: 06/22/2023 ID: Jon Collins, DOB July 02, 1971, MRN [id number]  Primary Cardiologist:  Kristeen Miss, MD  Patient Profile    Jon Collins is a 52 y.o. male who presents to the clinic today for ***    Past medical history significant for: Palpitations/PAF. Onset October 2013. 30-day cardiac monitor 11/20/2018: Normal sinus rhythm with no significant arrhythmias. Echo 07/11/2019: EF 60 to 65%.  Moderate LVH.  Left ventricular septal wall thickness was moderately increased, mildly increased left ventricular posterior wall.  Indeterminate diastolic parameters.  No RWMA.  Normal RV size/function.  No significant valvular abnormalities. Hypertension. Hyperlipidemia. Lipid panel 03/27/2023: LDL 104, HDL 45, TG 94, total 168. OSA.  In summary, she was diagnosed with A-fib in October 2013 in the ED.  He spontaneously converted.  He wore a 30-day event monitor with no episodes of A-fib.  In 2020, patient contacted the office with complaints of palpitations and increased heart rate.  He was evaluated in the office and instructed to take an additional half tablet of carvedilol with sustained elevated heart rate.  He wore a 30-day event monitor which showed normal sinus rhythm and no significant arrhythmias.  CHA2DS2-VASc 1.  Not on anticoagulation.     History of Present Illness    Jon Collins is followed by Dr. Elease Hashimoto for the above outlined history.   Patient was last seen in the office by Dr. Elease Hashimoto on 05/31/2021 for routine follow-up.  He was doing well at that time and no medication changes were made.  Patient is now pending right shoulder arthroscopy with Dr. Roda Shutters.  Today, patient ***  Palpitations/PAF 30-day monitor May 2020 showed normal sinus rhythm with no significant arrhythmias.  Patient*** -Continue carvedilol, diltiazem.  Hypertension BP today*** -Continue carvedilol, diltiazem, hydrochlorothiazide.  Hyperlipidemia LDL September 2024  104. -Continue atorvastatin. -Followed by PCP.  OSA Patient reports compliance with CPAP*** -Continue consistent use of CPAP.***  Preoperative cardiovascular risk assessment  Right shoulder arthroscopy with Dr. Roda Shutters. According to the [facility], patient has a 0.4% risk of MACE. Patient reports activity equivalent to 4.0 METS (***).  -Based on ACC/AHA guidelines, Jon Collins would be at acceptable risk for the planned procedure without further cardiovascular testing.  -Ideally aspirin should be continued without interruption, however if the bleeding risk is too great, aspirin may be held for 5-7 days prior to surgery. Please resume aspirin post operatively when it is felt to be safe from a bleeding standpoint.       ROS: All other systems reviewed and are otherwise negative except as noted in History of Present Illness.  Studies Reviewed       ***  Risk Assessment/Calculations    {Does this patient have ATRIAL FIBRILLATION?:[phone]} No BP recorded.  {Refresh Note OR Click here to enter BP  :1}***        Physical Exam    VS:  There were no vitals taken for this visit. , BMI There is no height or weight on file to calculate BMI.  GEN: Well nourished, well developed, in no acute distress. Neck: No JVD or carotid bruits. Cardiac: *** RRR. No murmurs. No rubs or gallops.   Respiratory:  Respirations regular and unlabored. Clear to auscultation without rales, wheezing or rhonchi. GI: Soft, nontender, nondistended. Extremities: Radials/DP/PT 2+ and equal bilaterally. No clubbing or cyanosis. No edema ***  Skin: Warm and dry, no rash. Neuro: Strength intact.  Assessment & Plan   ***  Disposition: ***     {Are  you ordering a CV Procedure (e.g. stress test, cath, DCCV, TEE, etc)?   Press F2        :[id number]}   Signed, Etta Grandchild. Mardel Grudzien, DNP, NP-C

## 2023-06-23 ENCOUNTER — Telehealth: Payer: Self-pay | Admitting: Student

## 2023-06-23 ENCOUNTER — Ambulatory Visit: Payer: BC Managed Care – PPO | Admitting: Student

## 2023-06-23 NOTE — Telephone Encounter (Signed)
Left voicemail to reschedule 12/13 appointment due to provider out of office

## 2023-06-28 NOTE — Progress Notes (Signed)
Cardiology Clinic Note   Date: 06/30/2023 ID: Jon Collins, DOB [birth year], MRN [id number]  Primary Cardiologist:  Kristeen Miss, MD  Patient Profile    Jon Collins is a 52 y.o. male who presents to the clinic today for preoperative evaluation.     Past medical history significant for: Palpitations/PAF. Onset October 2013. 30-day cardiac monitor 11/20/2018: Normal sinus rhythm with no significant arrhythmias. Echo 07/11/2019: EF 60 to 65%.  Moderate LVH.  Left ventricular septal wall thickness was moderately increased, mildly increased left ventricular posterior wall.  Indeterminate diastolic parameters.  No RWMA.  Normal RV size/function.  No significant valvular abnormalities. Hypertension. Hyperlipidemia. Lipid panel 03/27/2023: LDL 104, HDL 45, TG 94, total 168. OSA.  In summary, she was diagnosed with A-fib in October 2013 in the ED.  He spontaneously converted.  He wore a 30-day event monitor with no episodes of A-fib.  In 2020, patient contacted the office with complaints of palpitations and increased heart rate.  He was evaluated in the office and instructed to take an additional half tablet of carvedilol with sustained elevated heart rate.  He wore a 30-day event monitor which showed normal sinus rhythm and no significant arrhythmias.  CHA2DS2-VASc 1.  Not on anticoagulation.     History of Present Illness    Jon Collins is followed by Dr. Elease Hashimoto for the above outlined history.   Patient was last seen in the office by Dr. Elease Hashimoto on 05/31/2021 for routine follow-up.  He was doing well at that time and no medication changes were made.  Patient is now pending right shoulder arthroscopy with Dr. Roda Shutters.  Today, patient reports he is doing well. Patient denies shortness of breath, dyspnea on exertion, lower extremity edema, orthopnea or PND. No chest pain, pressure, or tightness. No palpitations.  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 He has been somewhat limited secondary to shoulder pain and has been working more in [employer]. He does ride the stationary bike at the gym on the weekends and does some resistance training.       ROS: All other systems reviewed and are otherwise negative except as noted in History of Present Illness.  Studies Reviewed    EKG Interpretation Date/Time:  Friday June 30 2023 11:02:10 EST Ventricular Rate:  60 PR Interval:  154 QRS Duration:  98 QT Interval:  402 QTC Calculation: 402 R Axis:   28  Text Interpretation: Normal sinus rhythm Minimal voltage criteria for LVH, may be normal variant ( Sokolow-Lyon ) Early repolarization When compared with ECG of 05/31/2021 No significant change Confirmed by Carlos Levering [phone]) on 06/30/2023 11:16:58 AM       Physical Exam    VS:  BP 130/88   Pulse 60   Ht 5\' 8"  (1.727 m)   Wt 246 lb 9.6 oz (111.9 kg)   SpO2 97%   BMI 37.50 kg/m  , BMI Body mass index is 37.5 kg/m.  GEN: Well nourished, well developed, in no acute distress. Neck: No JVD or carotid bruits. Cardiac:  RRR. No murmurs. No rubs or gallops.   Respiratory:  Respirations regular and unlabored. Clear to auscultation without rales, wheezing or rhonchi. GI: Soft, nontender, nondistended. Extremities: Radials/DP/PT 2+ and equal bilaterally. No clubbing or cyanosis. No edema.  Skin: Warm and dry, no rash. Neuro: Strength intact.  Assessment & Plan   Palpitations/PAF 30-day monitor May 2020 showed normal sinus rhythm with no  significant arrhythmias.  Patient denies palpitations. EKG shows NSR today. RRR on exam.  -Continue carvedilol, diltiazem.  Hypertension BP today 130/88. No headaches or dizziness reported.  -Continue carvedilol, diltiazem, hydrochlorothiazide.  Hyperlipidemia LDL September 2024 104. -Continue atorvastatin. -Followed by PCP.  Preoperative cardiovascular risk assessment  Right shoulder arthroscopy with Dr. Roda Shutters. According to  the [facility], patient has a 0.4% risk of MACE. Patient reports activity equivalent to >4.0 METS (unloading HVAC equipment for work, riding stationary bike on the weekends).  -Based on ACC/AHA guidelines, Jon Collins would be at acceptable risk for the planned procedure without further cardiovascular testing.  -Ideally aspirin should be continued without interruption, however if the bleeding risk is too great, aspirin may be held for 5-7 days prior to surgery. Please resume aspirin post operatively when it is felt to be safe from a bleeding standpoint.    Disposition: Return in 1 year or sooner as needed.          Signed, Etta Grandchild. Wacey Zieger, DNP, NP-C

## 2023-06-30 ENCOUNTER — Ambulatory Visit: Payer: BC Managed Care – PPO | Attending: Student | Admitting: Student

## 2023-06-30 ENCOUNTER — Encounter: Payer: Self-pay | Admitting: Student

## 2023-06-30 VITALS — BP 130/88 | HR 60 | Ht 68.0 in | Wt 246.6 lb

## 2023-06-30 DIAGNOSIS — I480 Paroxysmal atrial fibrillation: Secondary | ICD-10-CM | POA: Diagnosis not present

## 2023-06-30 DIAGNOSIS — I10 Essential (primary) hypertension: Secondary | ICD-10-CM

## 2023-06-30 DIAGNOSIS — R002 Palpitations: Secondary | ICD-10-CM | POA: Diagnosis not present

## 2023-06-30 DIAGNOSIS — E782 Mixed hyperlipidemia: Secondary | ICD-10-CM | POA: Diagnosis not present

## 2023-06-30 DIAGNOSIS — Z01810 Encounter for preprocedural cardiovascular examination: Secondary | ICD-10-CM

## 2023-06-30 NOTE — Patient Instructions (Signed)
Medication Instructions:  Your Physician recommend you continue on your current medication as directed.    *If you need a refill on your cardiac medications before your next appointment, please call your pharmacy*   Lab Work: None ordered at this time    Follow-Up: At [facility], you and your health needs are our priority.  As part of our continuing mission to provide you with exceptional heart care, we have created designated Provider Care Teams.  These Care Teams include your primary Cardiologist (physician) and Advanced Practice Providers (APPs -  Physician Assistants and Nurse Practitioners) who all work together to provide you with the care you need, when you need it.  We recommend signing up for the patient portal called "MyChart".  Sign up information is provided on this After Visit Summary.  MyChart is used to connect with patients for Virtual Visits (Telemedicine).  Patients are able to view lab/test results, encounter notes, upcoming appointments, etc.  Non-urgent messages can be sent to your provider as well.   To learn more about what you can do with MyChart, go to [email or web address].    Your next appointment:   1 year(s)  Provider:   You may see Kristeen Miss, MD or one of the following Advanced Practice Providers on your designated Care Team:   Nicolasa Ducking, NP Eula Listen, PA-C Cadence Fransico Michael, PA-C Charlsie Quest, NP Carlos Levering, NP

## 2023-07-07 ENCOUNTER — Telehealth: Payer: Self-pay | Admitting: Orthopaedic Surgery

## 2023-07-07 NOTE — Telephone Encounter (Signed)
Patient called to schedule shoulder surgery and is now is scheduled for left shoulder scope, rotator cuff repair, subacromial decompression, distal clavicle excision, extensive debridement at [facility] 07-27-23. At this time Jon Collins would like to cancel the referral for injections related to the neck with Dr. Alvester Morin.  If any questions, the patient can be reached at [phone].

## 2023-07-11 ENCOUNTER — Telehealth: Payer: Self-pay | Admitting: Orthopaedic Surgery

## 2023-07-11 NOTE — Telephone Encounter (Signed)
Pt came in to fill out Auth form and pay $25 for [employer]

## 2023-07-17 ENCOUNTER — Telehealth: Payer: Self-pay | Admitting: Orthopaedic Surgery

## 2023-07-17 NOTE — Telephone Encounter (Signed)
[employer] forms received. To [employer] 

## 2023-07-18 ENCOUNTER — Other Ambulatory Visit: Payer: Self-pay | Admitting: Physician Assistant

## 2023-07-18 MED ORDER — HYDROCODONE-ACETAMINOPHEN 5-325 MG PO TABS: 1.0000 | ORAL_TABLET | Freq: Three times a day (TID) | ORAL | 0 refills | Status: DC | PRN

## 2023-07-18 MED ORDER — ONDANSETRON HCL 4 MG PO TABS: 4.0000 mg | ORAL_TABLET | Freq: Three times a day (TID) | ORAL | 0 refills | Status: DC | PRN

## 2023-07-22 ENCOUNTER — Encounter: Payer: Self-pay | Admitting: Cardiovascular Disease

## 2023-07-22 NOTE — Progress Notes (Signed)
This encounter was created in error - please disregard. Patient cancelled

## 2023-07-25 ENCOUNTER — Other Ambulatory Visit: Payer: Self-pay | Admitting: Physician Assistant

## 2023-07-26 ENCOUNTER — Ambulatory Visit: Payer: BC Managed Care – PPO | Admitting: Cardiovascular Disease

## 2023-07-26 ENCOUNTER — Encounter (HOSPITAL_BASED_OUTPATIENT_CLINIC_OR_DEPARTMENT_OTHER): Payer: Self-pay | Admitting: Orthopaedic Surgery

## 2023-07-26 ENCOUNTER — Other Ambulatory Visit: Payer: Self-pay

## 2023-07-27 ENCOUNTER — Ambulatory Visit (HOSPITAL_BASED_OUTPATIENT_CLINIC_OR_DEPARTMENT_OTHER): Payer: Self-pay | Admitting: Anesthesiology

## 2023-07-27 ENCOUNTER — Ambulatory Visit (HOSPITAL_BASED_OUTPATIENT_CLINIC_OR_DEPARTMENT_OTHER)
Admission: RE | Admit: 2023-07-27 | Discharge: 2023-07-27 | Disposition: A | Discharge status: Home / Self Care | Payer: BC Managed Care – PPO | Attending: Orthopaedic Surgery | Admitting: Orthopaedic Surgery

## 2023-07-27 ENCOUNTER — Other Ambulatory Visit: Payer: Self-pay

## 2023-07-27 ENCOUNTER — Encounter (HOSPITAL_BASED_OUTPATIENT_CLINIC_OR_DEPARTMENT_OTHER): Payer: Self-pay | Admitting: Orthopaedic Surgery

## 2023-07-27 ENCOUNTER — Encounter: Payer: Self-pay | Admitting: Orthopaedic Surgery

## 2023-07-27 ENCOUNTER — Encounter (HOSPITAL_BASED_OUTPATIENT_CLINIC_OR_DEPARTMENT_OTHER)
Admission: RE | Disposition: A | Discharge status: Home / Self Care | Payer: Self-pay | Source: Home / Self Care | Attending: Orthopaedic Surgery

## 2023-07-27 DIAGNOSIS — G4730 Sleep apnea, unspecified: Secondary | ICD-10-CM | POA: Insufficient documentation

## 2023-07-27 DIAGNOSIS — G8918 Other acute postprocedural pain: Secondary | ICD-10-CM | POA: Diagnosis not present

## 2023-07-27 DIAGNOSIS — M75122 Complete rotator cuff tear or rupture of left shoulder, not specified as traumatic: Secondary | ICD-10-CM | POA: Insufficient documentation

## 2023-07-27 DIAGNOSIS — Z79899 Other long term (current) drug therapy: Secondary | ICD-10-CM | POA: Diagnosis not present

## 2023-07-27 DIAGNOSIS — M7542 Impingement syndrome of left shoulder: Secondary | ICD-10-CM | POA: Diagnosis not present

## 2023-07-27 DIAGNOSIS — M19012 Primary osteoarthritis, left shoulder: Secondary | ICD-10-CM

## 2023-07-27 DIAGNOSIS — Z01818 Encounter for other preprocedural examination: Secondary | ICD-10-CM

## 2023-07-27 DIAGNOSIS — M75102 Unspecified rotator cuff tear or rupture of left shoulder, not specified as traumatic: Secondary | ICD-10-CM | POA: Diagnosis not present

## 2023-07-27 DIAGNOSIS — S46012A Strain of muscle(s) and tendon(s) of the rotator cuff of left shoulder, initial encounter: Secondary | ICD-10-CM

## 2023-07-27 DIAGNOSIS — I10 Essential (primary) hypertension: Secondary | ICD-10-CM | POA: Diagnosis not present

## 2023-07-27 DIAGNOSIS — M25812 Other specified joint disorders, left shoulder: Secondary | ICD-10-CM | POA: Diagnosis not present

## 2023-07-27 HISTORY — DX: Other specified postprocedural states: Z98.890

## 2023-07-27 HISTORY — PX: SHOULDER ARTHROSCOPY WITH ROTATOR CUFF REPAIR AND SUBACROMIAL DECOMPRESSION: SHX5686

## 2023-07-27 HISTORY — DX: Unspecified osteoarthritis, unspecified site: M19.90

## 2023-07-27 HISTORY — PX: SHOULDER ARTHROSCOPY WITH DISTAL CLAVICLE RESECTION: SHX5675

## 2023-07-27 SURGERY — SHOULDER ARTHROSCOPY WITH ROTATOR CUFF REPAIR AND SUBACROMIAL DECOMPRESSION
Anesthesia: General | Site: Shoulder | Laterality: Left

## 2023-07-27 MED ORDER — PHENYLEPHRINE 80 MCG/ML (10ML) SYRINGE FOR IV PUSH (FOR BLOOD PRESSURE SUPPORT)
PREFILLED_SYRINGE | INTRAVENOUS | Status: DC | PRN
  Administered 2023-07-27: 40 ug via INTRAVENOUS

## 2023-07-27 MED ORDER — LACTATED RINGERS IV SOLN: INTRAVENOUS | Status: DC

## 2023-07-27 MED ORDER — CEFAZOLIN SODIUM-DEXTROSE 2-4 GM/100ML-% IV SOLN
INTRAVENOUS | Status: AC
Start: 2023-07-27 — End: ?
  Filled 2023-07-27: qty 100

## 2023-07-27 MED ORDER — ONDANSETRON HCL 4 MG/2ML IJ SOLN
INTRAMUSCULAR | Status: DC | PRN
  Administered 2023-07-27: 4 mg via INTRAVENOUS

## 2023-07-27 MED ORDER — EPHEDRINE 5 MG/ML INJ
INTRAVENOUS | Status: AC
Start: 2023-07-27 — End: ?
  Filled 2023-07-27: qty 5

## 2023-07-27 MED ORDER — ACETAMINOPHEN 500 MG PO TABS
1000.0000 mg | ORAL_TABLET | Freq: Once | ORAL | Status: AC
  Administered 2023-07-27: 1000 mg via ORAL

## 2023-07-27 MED ORDER — PHENYLEPHRINE 80 MCG/ML (10ML) SYRINGE FOR IV PUSH (FOR BLOOD PRESSURE SUPPORT)
PREFILLED_SYRINGE | INTRAVENOUS | Status: AC
  Filled 2023-07-27: qty 10

## 2023-07-27 MED ORDER — OXYCODONE HCL 5 MG/5ML PO SOLN: 5.0000 mg | Freq: Once | ORAL | Status: DC | PRN

## 2023-07-27 MED ORDER — MIDAZOLAM HCL 2 MG/2ML IJ SOLN
2.0000 mg | Freq: Once | INTRAMUSCULAR | Status: AC
  Administered 2023-07-27: 2 mg via INTRAVENOUS

## 2023-07-27 MED ORDER — ROCURONIUM BROMIDE 10 MG/ML (PF) SYRINGE
PREFILLED_SYRINGE | INTRAVENOUS | Status: DC | PRN
  Administered 2023-07-27: 60 mg via INTRAVENOUS
  Administered 2023-07-27 (×2): 10 mg via INTRAVENOUS

## 2023-07-27 MED ORDER — ONDANSETRON HCL 4 MG/2ML IJ SOLN
INTRAMUSCULAR | Status: AC
  Filled 2023-07-27: qty 2

## 2023-07-27 MED ORDER — SUGAMMADEX SODIUM 200 MG/2ML IV SOLN
INTRAVENOUS | Status: DC | PRN
  Administered 2023-07-27: 300 mg via INTRAVENOUS

## 2023-07-27 MED ORDER — PROPOFOL 10 MG/ML IV BOLUS
INTRAVENOUS | Status: AC
Start: 2023-07-27 — End: ?
  Filled 2023-07-27: qty 20

## 2023-07-27 MED ORDER — ROCURONIUM BROMIDE 10 MG/ML (PF) SYRINGE
PREFILLED_SYRINGE | INTRAVENOUS | Status: AC
  Filled 2023-07-27: qty 10

## 2023-07-27 MED ORDER — ONDANSETRON HCL 4 MG/2ML IJ SOLN
INTRAMUSCULAR | Status: AC
Start: 2023-07-27 — End: ?
  Filled 2023-07-27: qty 2

## 2023-07-27 MED ORDER — PHENYLEPHRINE HCL-NACL 20-0.9 MG/250ML-% IV SOLN
INTRAVENOUS | Status: DC | PRN
  Administered 2023-07-27: 50 ug/min via INTRAVENOUS

## 2023-07-27 MED ORDER — LIDOCAINE 2% (20 MG/ML) 5 ML SYRINGE
INTRAMUSCULAR | Status: AC
  Filled 2023-07-27: qty 5

## 2023-07-27 MED ORDER — FENTANYL CITRATE (PF) 100 MCG/2ML IJ SOLN: 25.0000 ug | INTRAMUSCULAR | Status: DC | PRN

## 2023-07-27 MED ORDER — SODIUM CHLORIDE 0.9 % IV SOLN: INTRAVENOUS | Status: DC | PRN

## 2023-07-27 MED ORDER — FENTANYL CITRATE (PF) 100 MCG/2ML IJ SOLN
INTRAMUSCULAR | Status: AC
  Filled 2023-07-27: qty 2

## 2023-07-27 MED ORDER — BUPIVACAINE-EPINEPHRINE (PF) 0.5% -1:200000 IJ SOLN
INTRAMUSCULAR | Status: DC | PRN
  Administered 2023-07-27: 15 mL via PERINEURAL

## 2023-07-27 MED ORDER — ACETAMINOPHEN 500 MG PO TABS
ORAL_TABLET | ORAL | Status: AC
Start: 2023-07-27 — End: ?
  Filled 2023-07-27: qty 2

## 2023-07-27 MED ORDER — LIDOCAINE 2% (20 MG/ML) 5 ML SYRINGE
INTRAMUSCULAR | Status: DC | PRN
  Administered 2023-07-27: 100 mg via INTRAVENOUS

## 2023-07-27 MED ORDER — GLYCOPYRROLATE PF 0.2 MG/ML IJ SOSY
PREFILLED_SYRINGE | INTRAMUSCULAR | Status: AC
  Filled 2023-07-27: qty 1

## 2023-07-27 MED ORDER — OXYCODONE HCL 5 MG PO TABS: 5.0000 mg | ORAL_TABLET | Freq: Once | ORAL | Status: DC | PRN

## 2023-07-27 MED ORDER — DEXAMETHASONE SODIUM PHOSPHATE 4 MG/ML IJ SOLN
INTRAMUSCULAR | Status: DC | PRN
  Administered 2023-07-27: 10 mg via INTRAVENOUS

## 2023-07-27 MED ORDER — MIDAZOLAM HCL 2 MG/2ML IJ SOLN
INTRAMUSCULAR | Status: AC
Start: 2023-07-27 — End: ?
  Filled 2023-07-27: qty 2

## 2023-07-27 MED ORDER — FENTANYL CITRATE (PF) 100 MCG/2ML IJ SOLN
50.0000 ug | Freq: Once | INTRAMUSCULAR | Status: AC
  Administered 2023-07-27: 50 ug via INTRAVENOUS

## 2023-07-27 MED ORDER — CEFAZOLIN SODIUM-DEXTROSE 2-4 GM/100ML-% IV SOLN
2.0000 g | INTRAVENOUS | Status: AC
  Administered 2023-07-27: 2 g via INTRAVENOUS

## 2023-07-27 MED ORDER — SODIUM CHLORIDE 0.9 % IR SOLN
Status: DC | PRN
  Administered 2023-07-27: 45000 mL

## 2023-07-27 MED ORDER — PROPOFOL 10 MG/ML IV BOLUS
INTRAVENOUS | Status: DC | PRN
  Administered 2023-07-27: 200 mg via INTRAVENOUS

## 2023-07-27 MED ORDER — DEXAMETHASONE SODIUM PHOSPHATE 10 MG/ML IJ SOLN
INTRAMUSCULAR | Status: AC
  Filled 2023-07-27: qty 1

## 2023-07-27 MED ORDER — BUPIVACAINE LIPOSOME 1.3 % IJ SUSP
INTRAMUSCULAR | Status: DC | PRN
  Administered 2023-07-27: 10 mL via PERINEURAL

## 2023-07-27 MED ORDER — DROPERIDOL 2.5 MG/ML IJ SOLN: 0.6250 mg | Freq: Once | INTRAMUSCULAR | Status: DC | PRN

## 2023-07-27 SURGICAL SUPPLY — 50 items
ANCHOR SWIVELOCK SP KL 4.75 (Anchor) ×2 IMPLANT
BIOBEADS ARTH CALC SULFATE 10C (Bone Implant) IMPLANT
BLADE EXCALIBUR 4.0X13 (MISCELLANEOUS) IMPLANT
BURR OVAL 8 FLU 4.0X13 (MISCELLANEOUS) ×2 IMPLANT
CANNULA 5.75X71 LONG (CANNULA) ×2 IMPLANT
CANNULA SHOULDER 7CM (CANNULA) IMPLANT
CANNULA TWIST IN 8.25X7CM (CANNULA) IMPLANT
COOLER ICEMAN CLASSIC (MISCELLANEOUS) ×2 IMPLANT
DISSECTOR 3.8MM X 13CM (MISCELLANEOUS) ×2 IMPLANT
DRAPE IMP U-DRAPE 54X76 (DRAPES) ×2 IMPLANT
DRAPE INCISE IOBAN 66X45 STRL (DRAPES) ×2 IMPLANT
DRAPE STERI 35X30 U-POUCH (DRAPES) ×2 IMPLANT
DRAPE U-SHAPE 47X51 STRL (DRAPES) ×4 IMPLANT
DRAPE U-SHAPE 76X120 STRL (DRAPES) ×4 IMPLANT
DURAPREP 26ML APPLICATOR (WOUND CARE) ×4 IMPLANT
ELECT REM PT RETURN 9FT ADLT (ELECTROSURGICAL)
ELECTRODE REM PT RTRN 9FT ADLT (ELECTROSURGICAL) IMPLANT
GAUZE PAD ABD 8X10 STRL (GAUZE/BANDAGES/DRESSINGS) ×4 IMPLANT
GAUZE SPONGE 4X4 12PLY STRL (GAUZE/BANDAGES/DRESSINGS) ×4 IMPLANT
GAUZE XEROFORM 1X8 LF (GAUZE/BANDAGES/DRESSINGS) ×2 IMPLANT
GLOVE BIOGEL PI IND STRL 7.5 (GLOVE) ×2 IMPLANT
GLOVE ECLIPSE 7.0 STRL STRAW (GLOVE) ×4 IMPLANT
GLOVE INDICATOR 7.0 STRL GRN (GLOVE) ×2 IMPLANT
GLOVE SURG SYN 7.5 E (GLOVE) ×4 IMPLANT
GLOVE SURG SYN 7.5 PF PI (GLOVE) ×4 IMPLANT
GOWN STRL REUS W/ TWL LRG LVL3 (GOWN DISPOSABLE) ×2 IMPLANT
GOWN STRL SURGICAL XL XLNG (GOWN DISPOSABLE) ×4 IMPLANT
MANIFOLD NEPTUNE II (INSTRUMENTS) ×2 IMPLANT
NDL HD SCORPION MEGA LOADER (NEEDLE) IMPLANT
PACK ARTHROSCOPY DSU (CUSTOM PROCEDURE TRAY) ×2 IMPLANT
PACK BASIN DAY SURGERY FS (CUSTOM PROCEDURE TRAY) ×2 IMPLANT
PAD COLD SHLDR WRAP-ON (PAD) ×2 IMPLANT
PAD ORTHO SHOULDER 7X19 LRG (SOFTGOODS) IMPLANT
SHEET MEDIUM DRAPE 40X70 STRL (DRAPES) ×2 IMPLANT
SLEEVE SCD COMPRESS KNEE MED (STOCKING) ×2 IMPLANT
SLING ARM FOAM STRAP LRG (SOFTGOODS) IMPLANT
SPIKE FLUID TRANSFER (MISCELLANEOUS) IMPLANT
SUT ETHILON 3 0 PS 1 (SUTURE) ×2 IMPLANT
SUT FIBERWIRE #2 38 T-5 BLUE (SUTURE) ×4
SUT TIGER TAPE 7 IN WHITE (SUTURE) IMPLANT
SUTURE FIBERWR #2 38 T-5 BLUE (SUTURE) IMPLANT
SUTURE TAPE 1.3 40 TPR END (SUTURE) IMPLANT
SUTURE TAPE TIGERLINK 1.3MM BL (SUTURE) IMPLANT
SUTURETAPE 1.3 40 TPR END (SUTURE) ×2
SUTURETAPE TIGERLINK 1.3MM BL (SUTURE)
TAPE FIBER 2MM 7IN #2 BLUE (SUTURE) IMPLANT
TOWEL GREEN STERILE FF (TOWEL DISPOSABLE) ×2 IMPLANT
TUBE CONNECTING 20X1/4 (TUBING) ×2 IMPLANT
TUBING ARTHROSCOPY IRRIG 16FT (MISCELLANEOUS) ×2 IMPLANT
WAND ABLATOR APOLLO I90 (BUR) ×2 IMPLANT

## 2023-07-27 NOTE — Transfer of Care (Signed)
Immediate Anesthesia Transfer of Care Note  Patient: Jon Collins  Procedure(s) Performed: LEFT SHOULDER ARTHROSCOPY WITH ROTATOR CUFF REPAIR, SUBACROMIAL DECOMPRESSION (Left) DISTAL CLAVICLE EXCISION, EXTENSIVE DEBRIDEMENT (Left: Shoulder)  Patient Location: PACU  Anesthesia Type:General and Regional  Level of Consciousness: awake  Airway & Oxygen Therapy: Patient Spontanous Breathing and Patient connected to face mask oxygen  Post-op Assessment: Report given to RN and Post -op Vital signs reviewed and stable  Post vital signs: Reviewed and stable  Last Vitals:  Vitals Value Taken Time  BP 122/84 07/27/23 1620  Temp    Pulse 87 07/27/23 1621  Resp 18 07/27/23 1621  SpO2 98 % 07/27/23 1621  Vitals shown include unfiled device data.  Last Pain:  Vitals:   07/27/23 1145  TempSrc: Temporal  PainSc: 0-No pain      Patients Stated Pain Goal: 3 (07/27/23 1145)  Complications: No notable events documented.

## 2023-07-27 NOTE — Op Note (Signed)
Date of Surgery: 07/27/2023  INDICATIONS: The patient is a 53 year old gentleman with left shoulder pain that has failed conservative treatment;  The patient did consent to the procedure after discussion of the risks and benefits.  DIAGNOSES: Left shoulder, chronic rotator cuff tear, AC arthritis, and subacromial impingement.  POST-OPERATIVE DIAGNOSIS: same  PROCEDURE: Arthroscopic extensive debridement - 29823 Subdeltoid Bursa, Supraspinatus Tendon, Anterior Labrum, Superior Labrum, and Posterior Labrum Arthroscopic distal clavicle excision - 40981 Arthroscopic subacromial decompression - 19147 Arthroscopic rotator cuff repair - 82956   OPERATIVE FINDING: Exam under anesthesia: Normal Articular space: Normal Chondral surfaces: Normal Biceps:  Mild tendinosis Subscapularis: Intact  Supraspinatus: Complete tear anterior 1 cm Infraspinatus: Intact   SURGEON: N. Glee Arvin, M.D.  ASSIST: Oneal Grout, PA-C  ANESTHESIA:  general, regional  IV FLUIDS AND URINE: See anesthesia.  ESTIMATED BLOOD LOSS: minimal mL.  IMPLANTS:  Implant Name Type Inv. Item Serial No. Manufacturer Lot No. LRB No. Used Action  ANCHOR SWIVELOCK SP KL 4.75 - [id number] Anchor ANCHOR SWIVELOCK SP KL 4.75  [employer] [id number] Left [address] SWIVELOCK SP KL 4.75 - [id number] Anchor ANCHOR SWIVELOCK SP KL 4.75  [employer] [id number] Left 1 Implanted    COMPLICATIONS: None.  DESCRIPTION OF PROCEDURE: The patient was brought to the operating room and placed supine on the operating table.  The patient had been signed prior to the procedure and this was documented. The patient had the anesthesia placed by the anesthesiologist.  A time-out was performed to confirm that this was the correct patient, site, side and location. The patient did receive antibiotics prior to the incision and was re-dosed during the procedure as needed at indicated intervals.  The patient was then positioned into the  beach chair position with all bony prominences well padded and neutral C spine. The patient had the operative extremity prepped and draped in the standard surgical fashion.    Incisions were made for standard shoulder arthroscopy portals.  Diagnostic shoulder arthroscopy ensued.  There was fair amount of erythema within the shoulder joint and the rotator interval.  Chondral surfaces were unremarkable.  Degenerative labral tearing was encountered on the anterior superior and posterior portions.  Gentle debridement was performed.  The proximal biceps tendon showed mild tendinosis.  The articular surface of the cuff and partial-thickness tearing less than 10%.  This was gently debrided.  Arthroscope was then repositioned into the subacromial space.  An additional posterior lateral portal was created for the scope and a lateral portal was created for the cuff repair.  There was thick and subdeltoid and subacromial bursitis.  This was debrided away with the oscillating shaver and ArthroCare wand.  AC joint was severely arthritic and bulky inferior osteophytes.  Unfavorable acromial anatomy was encountered.  The arm was placed into external rotation and a full-thickness crescent-shaped tear was recognized in the anterior supraspinatus measuring approximately a centimeter wide.  The lateral edge of the cuff was prepared with a meniscus basket to freshen the edge.  The underlying bone was excoriated down to bleeding bone with a bur.  Two #2 FiberWire sutures were passed through the cuff with a scorpion and inverted horizontal mattress creating 4 strands which were anchored into the lateral proximal humerus with a 4.75 mm swivel lock.  The cuff reduced nicely.  Dogears were tacked down with the knotless mechanism.  We then turned our attention to the acromioplasty.  The CA ligament was released from the acromion.  The bur was used  to perform the acromioplasty.  We then turned our attention to the distal clavicle excision.   An ArthroCare wand was used to skeletonize the inferior surface of the distal clavicle.  Distal clavicle excision was performed with a bur removing approximately 6 mm.  A couple of millimeters of the acromion was removed from the [facility] joint.  Excess fluid was expressed from the shoulder.  Incisions closed with nylon.  Shoulder sling with abduction pillow was placed.  Patient tolerated the procedure well had no any complications. Tessa Lerner, my PA, was a medical necessity for the entirety of the surgery including opening, closing, limb positioning, retracting, exposing, and repairing.  POSTOPERATIVE PLAN: The patient will be nonweightbearing and remain in sling at all times.  He is to follow-up in the office in 1 week to begin physical therapy.  Mayra Reel, MD [facility] 4:03 PM

## 2023-07-27 NOTE — H&P (Signed)
PREOPERATIVE H&P  Chief Complaint: left rotator cuff tear, impingement, acromioclavicular arthritis  HPI: Jon Collins is a 53 y.o. male who presents for surgical treatment of left rotator cuff tear, impingement, acromioclavicular arthritis.  He denies any changes in medical history.  Past Surgical History:  Procedure Laterality Date   COLONOSCOPY     UMBILICAL HERNIA REPAIR     Social History   Socioeconomic History   Marital status: Single    Spouse name: Not on file   Number of children: Not on file   Years of education: Not on file   Highest education level: Not on file  Occupational History   Not on file  Tobacco Use   Smoking status: Never   Smokeless tobacco: Never  Vaping Use   Vaping status: Never Used  Substance and Sexual Activity   Alcohol use: Yes    Comment: social   Drug use: No   Sexual activity: Not on file  Other Topics Concern   Not on file  Social History Narrative   Not on file   Social Drivers of Health   Financial Resource Strain: Low Risk  (08/03/2022)   Received from [facility], [facility]   Overall Financial Resource Strain (CARDIA)    Difficulty of Paying Living Expenses: Not hard at all  Food Insecurity: No Food Insecurity (08/03/2022)   Received from [facility], [facility]   Hunger Vital Sign    Worried About Running Out of Food in the Last Year: Never true    Ran Out of Food in the Last Year: Never true  Transportation Needs: No Transportation Needs (08/03/2022)   Received from [facility], [facility]   PRAPARE - Transportation    Lack of Transportation (Medical): No    Lack of Transportation (Non-Medical): No  Physical Activity: Not on file  Stress: Not on file  Social Connections: Unknown (11/22/2021)   Received from [facility], [facility]   Social Network    Social Network: Not on file   Family History  Problem Relation Age of Onset   Hyperlipidemia Father    Stroke Maternal Grandmother     Heart attack Neg Hx    Allergies  Allergen Reactions   Amoxicillin Hives   Prior to Admission medications   Medication Sig Start Date End Date Taking? Authorizing Provider  atorvastatin (LIPITOR) 20 MG tablet TAKE ONE TABLET BY MOUTH ONCE DAILY 05/16/16  Yes Nahser, Deloris Ping, MD  carvedilol (COREG) 12.5 MG tablet TAKE 1.5 TABLETS (18.75 MG TOTAL) BY MOUTH 2 (TWO) TIMES DAILY WITH A MEAL. 07/21/20  Yes Nahser, Deloris Ping, MD  diltiazem [facility]) 180 MG 24 hr capsule Take 1 capsule by mouth daily. 01/16/20  Yes [provider]  lisinopril-hydrochlorothiazide (ZESTORETIC) 20-25 MG tablet Take 1 tablet by mouth daily. 07/26/22  Yes [provider]  sertraline (ZOLOFT) 50 MG tablet Take 50 mg by mouth daily.   Yes [provider]  aspirin EC 81 MG tablet Take 81 mg by mouth daily. Patient not taking: Reported on 06/30/2023    [provider]  diclofenac (VOLTAREN) 75 MG EC tablet Take 1 tablet (75 mg total) by mouth 2 (two) times daily as needed. Patient not taking: Reported on 06/30/2023 05/11/23   Cristie Hem, PA-C  HYDROcodone-acetaminophen [facility]) 5-325 MG tablet Take 1 tablet by mouth 3 (three) times daily as needed. To be taken after surgery 07/18/23   Cristie Hem, PA-C  methocarbamol (ROBAXIN-750) 750 MG tablet Take  1 tablet (750 mg total) by mouth 2 (two) times daily as needed for muscle spasms. Patient not taking: Reported on 06/30/2023 03/24/23   Cristie Hem, PA-C  ondansetron (ZOFRAN) 4 MG tablet Take 1 tablet (4 mg total) by mouth every 8 (eight) hours as needed for nausea or vomiting. 07/18/23   Cristie Hem, PA-C  tadalafil (CIALIS) 5 MG tablet Take 1 tablet by mouth as needed.    [provider]     Positive ROS: All other systems have been reviewed and were otherwise negative with the exception of those mentioned in the HPI and as above.  Physical Exam: General: Alert, no acute distress Cardiovascular: No pedal  edema Respiratory: No cyanosis, no use of accessory musculature GI: abdomen soft Skin: No lesions in the area of chief complaint Neurologic: Sensation intact distally Psychiatric: Patient is competent for consent with normal mood and affect Lymphatic: no lymphedema  MUSCULOSKELETAL: exam stable  Assessment: left rotator cuff tear, impingement, acromioclavicular arthritis  Plan: Plan for Procedure(s): LEFT SHOULDER ARTHROSCOPY WITH ROTATOR CUFF REPAIR, SUBACROMIAL DECOMPRESSION DISTAL CLAVICLE EXCISION, EXTENSIVE DEBRIDEMENT  The risks benefits and alternatives were discussed with the patient including but not limited to the risks of nonoperative treatment, versus surgical intervention including infection, bleeding, nerve injury,  blood clots, cardiopulmonary complications, morbidity, mortality, among others, and they were willing to proceed.   Glee Arvin, MD 07/27/2023 1:02 PM

## 2023-07-27 NOTE — Anesthesia Preprocedure Evaluation (Addendum)
Anesthesia Evaluation  Patient identified by MRN, date of birth, ID band Patient awake    Reviewed: Allergy & Precautions, NPO status , Patient's Chart, lab work & pertinent test results, reviewed documented beta blocker date and time   History of Anesthesia Complications (+) PONV and history of anesthetic complications  Airway Mallampati: II  TM Distance: >3 FB Neck ROM: Full    Dental  (+) Missing, Partial Upper,    Pulmonary sleep apnea    Pulmonary exam normal        Cardiovascular hypertension, Pt. on medications and Pt. on home beta blockers Normal cardiovascular exam     Neuro/Psych   Anxiety        GI/Hepatic negative GI ROS, Neg liver ROS,,,  Endo/Other  negative endocrine ROS    Renal/GU negative Renal ROS     Musculoskeletal  (+) Arthritis ,  left rotator cuff tear, impingement, acromioclavicular arthritis   Abdominal   Peds  Hematology negative hematology ROS (+)   Anesthesia Other Findings Day of surgery medications reviewed with patient.  Reproductive/Obstetrics                             Anesthesia Physical Anesthesia Plan  ASA: 2  Anesthesia Plan: General   Post-op Pain Management: Tylenol PO (pre-op)* and Regional block*   Induction: Intravenous  PONV Risk Score and Plan: 3 and Treatment may vary due to age or medical condition, Ondansetron, Dexamethasone and Midazolam  Airway Management Planned: Oral ETT  Additional Equipment: None  Intra-op Plan:   Post-operative Plan: Extubation in OR  Informed Consent: I have reviewed the patients History and Physical, chart, labs and discussed the procedure including the risks, benefits and alternatives for the proposed anesthesia with the patient or authorized representative who has indicated his/her understanding and acceptance.     Dental advisory given  Plan Discussed with: CRNA  Anesthesia Plan Comments:         Anesthesia Quick Evaluation

## 2023-07-27 NOTE — Anesthesia Procedure Notes (Addendum)
Anesthesia Regional Block: Interscalene brachial plexus block   Pre-Anesthetic Checklist: , timeout performed,  Correct Patient, Correct Site, Correct Laterality,  Correct Procedure, Correct Position, site marked,  Risks and benefits discussed,  Pre-op evaluation,  At surgeon's request and post-op pain management  Laterality: Left  Prep: Maximum Sterile Barrier Precautions used, chloraprep       Needles:  Injection technique: Single-shot  Needle Type: Echogenic Stimulator Needle     Needle Length: 4cm  Needle Gauge: 22     Additional Needles:   Procedures:,,,, ultrasound used (permanent image in chart),,    Narrative:  Start time: 07/27/2023 1:06 PM End time: 07/27/2023 1:09 PM Injection made incrementally with aspirations every 5 mL.  Performed by: Personally  Anesthesiologist: Kaylyn Layer, MD  Additional Notes: Risks, benefits, and alternative discussed. Patient gave consent for procedure. Patient prepped and draped in sterile fashion. Sedation administered, patient remains easily responsive to voice. Relevant anatomy identified with ultrasound guidance. Local anesthetic given in 5cc increments with no signs or symptoms of intravascular injection. No pain or paraesthesias with injection. Patient monitored throughout procedure with signs of LAST or immediate complications. Tolerated well. Ultrasound image placed in chart.  Amalia Greenhouse, MD

## 2023-07-27 NOTE — Anesthesia Procedure Notes (Signed)
Procedure Name: Intubation Date/Time: 07/27/2023 1:46 PM  Performed by: Montez Morita, Elyssa Pendelton W, CRNAPre-anesthesia Checklist: Patient identified, Emergency Drugs available, Suction available and Patient being monitored Patient Re-evaluated:Patient Re-evaluated prior to induction Oxygen Delivery Method: Circle system utilized Preoxygenation: Pre-oxygenation with 100% oxygen Induction Type: IV induction Ventilation: Mask ventilation without difficulty Laryngoscope Size: Miller and 2 Grade View: Grade II Tube type: Oral Tube size: 7.5 mm Number of attempts: 2 Airway Equipment and Method: Stylet Placement Confirmation: ETT inserted through vocal cords under direct vision, positive ETCO2 and breath sounds checked- equal and bilateral Secured at: 23 cm Tube secured with: Tape Dental Injury: Teeth and Oropharynx as per pre-operative assessment

## 2023-07-27 NOTE — Discharge Instructions (Addendum)
Post-operative patient instructions  Shoulder Arthroscopy   Ice:  Place intermittent ice or cooler pack over your shoulder, 30 minutes on and 30 minutes off.  Continue this for the first 72 hours after surgery, then save ice for use after therapy sessions or on more active days.   Weight:  You may not bear weight on your arm as your symptoms allow. Motion:  Perform gentle shoulder motion as tolerated Dressing:  Perform 1st dressing change at 2 days postoperative. A moderate amount of blood tinged drainage is to be expected.  So if you bleed through the dressing on the first or second day or if you have fevers, it is fine to change the dressing/check the wounds early and redress wound.  If it bleeds through again, or if the incisions are leaking frank blood, please call the office. May change dressing every 1-2 days thereafter to help watch wounds. You may place regular band-aids over the incisions.   Shower:  Light shower is ok after 2 days.  Please take shower, NO bath. Recover with gauze and ace wrap to help keep wounds protected.   Pain medication:  A narcotic pain medication has been prescribed.  Take as directed.  Typically you need narcotic pain medication more regularly during the first 3 to 5 days after surgery.  Decrease your use of the medication as the pain improves.  Narcotics can sometimes cause constipation, even after a few doses.  If you have problems with constipation, you can take an over the counter stool softener or light laxative.  If you have persistent problems, please notify your physician's office. Physical therapy: Additional activity guidelines to be provided by your physician or physical therapist at follow-up visits.  Driving: Do not recommend driving x 2 weeks post surgical, especially if surgery performed on right side. Should not drive while taking narcotic pain medications. It typically takes at least 2 weeks to restore sufficient neuromuscular function for normal  reaction times for driving safety.  Call [phone] for questions or problems. Evenings you will be forwarded to the hospital operator.  Ask for the orthopaedic physician on call. Please call if you experience:    Redness, foul smelling, or persistent drainage from the surgical site  worsening shoulder pain and swelling not responsive to medication  any calf pain and or swelling of the lower leg  temperatures greater than 101.5 F other questions or concerns  Per [facility] clinic policy, our goal is ensure optimal postoperative pain control with a multimodal pain management strategy. For all [facility] patients, our goal is to wean post-operative narcotic medications by 6 weeks post-operatively. If this is not possible due to utilization of pain medication prior to surgery, your [facility] doctor will support your acute post-operative pain control for the first 6 weeks postoperatively, with a plan to transition you back to your primary pain team following that. Cyndia Skeeters will work to ensure a Therapist, occupational.  Thank you for allowing [name] to be a part of your care.  No Tylenol before 6pm tonight.   Post Anesthesia Home Care Instructions  Activity: Get plenty of rest for the remainder of the day. A responsible individual must stay with you for 24 hours following the procedure.  For the next 24 hours, DO NOT: -Drive a car -Advertising copywriter -Drink alcoholic beverages -Take any medication unless instructed by your physician -Make any legal decisions or sign important papers.  Meals: Start with liquid foods such as gelatin or soup. Progress to regular foods  as tolerated. Avoid greasy, spicy, heavy foods. If nausea and/or vomiting occur, drink only clear liquids until the nausea and/or vomiting subsides. Call your physician if vomiting continues.  Special Instructions/Symptoms: Your throat may feel dry or sore from the anesthesia or the breathing tube placed in your throat during surgery. If  this causes discomfort, gargle with warm salt water. The discomfort should disappear within 24 hours.  If you had a scopolamine patch placed behind your ear for the management of post- operative nausea and/or vomiting:  1. The medication in the patch is effective for 72 hours, after which it should be removed.  Wrap patch in a tissue and discard in the trash. Wash hands thoroughly with soap and water. 2. You may remove the patch earlier than 72 hours if you experience unpleasant side effects which may include dry mouth, dizziness or visual disturbances. 3. Avoid touching the patch. Wash your hands with soap and water after contact with the patch.     Regional Anesthesia Blocks  1. You may not be able to move or feel the "blocked" extremity after a regional anesthetic block. This may last may last from 3-48 hours after placement, but it will go away. The length of time depends on the medication injected and your individual response to the medication. As the nerves start to wake up, you may experience tingling as the movement and feeling returns to your extremity. If the numbness and inability to move your extremity has not gone away after 48 hours, please call your surgeon.   2. The extremity that is blocked will need to be protected until the numbness is gone and the strength has returned. Because you cannot feel it, you will need to take extra care to avoid injury. Because it may be weak, you may have difficulty moving it or using it. You may not know what position it is in without looking at it while the block is in effect.  3. For blocks in the legs and feet, returning to weight bearing and walking needs to be done carefully. You will need to wait until the numbness is entirely gone and the strength has returned. You should be able to move your leg and foot normally before you try and bear weight or walk. You will need someone to be with you when you first try to ensure you do not fall and possibly  risk injury.  4. Bruising and tenderness at the needle site are common side effects and will resolve in a few days.  5. Persistent numbness or new problems with movement should be communicated to the surgeon or the [facility] [phone] [facility] [phone]). Information for Discharge Teaching: EXPAREL (bupivacaine liposome injectable suspension)   Pain relief is important to your recovery. The goal is to control your pain so you can move easier and return to your normal activities as soon as possible after your procedure. Your physician may use several types of medicines to manage pain, swelling, and more.  Your surgeon or anesthesiologist gave you EXPAREL(bupivacaine) to help control your pain after surgery.  EXPAREL is a local anesthetic designed to release slowly over an extended period of time to provide pain relief by numbing the tissue around the surgical site. EXPAREL is designed to release pain medication over time and can control pain for up to 72 hours. Depending on how you respond to EXPAREL, you may require less pain medication during your recovery. EXPAREL can help reduce or eliminate the  need for opioids during the first few days after surgery when pain relief is needed the most. EXPAREL is not an opioid and is not addictive. It does not cause sleepiness or sedation.   Important! A teal colored band has been placed on your arm with the date, time and amount of EXPAREL you have received. Please leave this armband in place for the full 96 hours following administration, and then you may remove the band. If you return to the hospital for any reason within 96 hours following the administration of EXPAREL, the armband provides important information that your health care providers to know, and alerts them that you have received this anesthetic.    Possible side effects of EXPAREL: Temporary loss of sensation or ability to move in the area where  medication was injected. Nausea, vomiting, constipation Rarely, numbness and tingling in your mouth or lips, lightheadedness, or anxiety may occur. Call your doctor right away if you think you may be experiencing any of these sensations, or if you have other questions regarding possible side effects.  Follow all other discharge instructions given to you by your surgeon or nurse. Eat a healthy diet and drink plenty of water or other fluids.

## 2023-07-27 NOTE — Progress Notes (Signed)
Assisted Dr. Daiva Huge with left, supraclavicular, ultrasound guided block. Side rails up, monitors on throughout procedure. See vital signs in flow sheet. Tolerated Procedure well. ?

## 2023-07-28 ENCOUNTER — Encounter (HOSPITAL_BASED_OUTPATIENT_CLINIC_OR_DEPARTMENT_OTHER): Payer: Self-pay | Admitting: Orthopaedic Surgery

## 2023-07-28 NOTE — Anesthesia Postprocedure Evaluation (Signed)
Anesthesia Post Note  Patient: Jon Collins  Procedure(s) Performed: LEFT SHOULDER ARTHROSCOPY WITH ROTATOR CUFF REPAIR, SUBACROMIAL DECOMPRESSION (Left) DISTAL CLAVICLE EXCISION, EXTENSIVE DEBRIDEMENT (Left: Shoulder)     Patient location during evaluation: PACU Anesthesia Type: General Level of consciousness: awake and alert Pain management: pain level controlled Vital Signs Assessment: post-procedure vital signs reviewed and stable Respiratory status: spontaneous breathing, nonlabored ventilation and respiratory function stable Cardiovascular status: blood pressure returned to baseline Postop Assessment: no apparent nausea or vomiting Anesthetic complications: no   No notable events documented.  Last Vitals:  Vitals:   07/27/23 1630 07/27/23 1653  BP: 119/83 130/87  Pulse: 84 86  Resp: 18 20  Temp:  36.7 C  SpO2: 96% 93%    Last Pain:  Vitals:   07/27/23 1653  TempSrc: Temporal  PainSc:                  [name]

## 2023-07-31 ENCOUNTER — Encounter: Payer: Self-pay | Admitting: Cardiovascular Disease

## 2023-07-31 NOTE — Progress Notes (Signed)
Pt cancelled  This encounter was created in error - please disregard. 

## 2023-08-01 ENCOUNTER — Encounter (HOSPITAL_BASED_OUTPATIENT_CLINIC_OR_DEPARTMENT_OTHER): Payer: Self-pay | Admitting: Orthopaedic Surgery

## 2023-08-02 ENCOUNTER — Ambulatory Visit: Payer: BC Managed Care – PPO | Admitting: Cardiovascular Disease

## 2023-08-04 ENCOUNTER — Encounter: Payer: Self-pay | Admitting: Orthopaedic Surgery

## 2023-08-04 ENCOUNTER — Ambulatory Visit (INDEPENDENT_AMBULATORY_CARE_PROVIDER_SITE_OTHER): Payer: BC Managed Care – PPO | Admitting: Orthopaedic Surgery

## 2023-08-04 DIAGNOSIS — M7542 Impingement syndrome of left shoulder: Secondary | ICD-10-CM

## 2023-08-04 DIAGNOSIS — M75122 Complete rotator cuff tear or rupture of left shoulder, not specified as traumatic: Secondary | ICD-10-CM

## 2023-08-04 NOTE — Progress Notes (Signed)
Post-Op Visit Note   Patient: Jon Collins           Date of Birth: 07-16-1970           MRN: [id number] Visit Date: 08/04/2023 PCP: Jon Collin, MD   Assessment & Plan:  Chief Complaint:  Chief Complaint  Patient presents with   Left Shoulder - Follow-up    Scope 07/27/2023   Visit Diagnoses:  1. Nontraumatic complete tear of left rotator cuff   2. Impingement syndrome of left shoulder     Plan: [name] is 1 week status post left shoulder scope rotator cuff repair distal clavicle excision.  He feels an improvement in pain already.  He has no real complaints.  Examination of the left shoulder shows fully healed surgical incisions.  No signs of infection or drainage.  No neurovascular compromise.  Sutures removed Steri-Strips applied.  Arthroscopic photos reviewed with the patient.  Continue sling with abduction pillow.  Order has been placed for outpatient PT.  Recheck in 5 weeks.  Follow-Up Instructions: Return in about 5 weeks (around 09/08/2023) for with lindsey.   Orders:  Orders Placed This Encounter  Procedures   Ambulatory referral to Physical Therapy   No orders of the defined types were placed in this encounter.   Imaging: No results found.  PMFS History: Patient Active Problem List   Diagnosis Date Noted   Complete tear of left rotator cuff 07/27/2023   Impingement syndrome of left shoulder 07/27/2023   Arthritis of left acromioclavicular joint 07/27/2023   Reduced libido 11/09/2017   Decreased testosterone level 11/07/2017   Mitral valve prolapse 01/14/2016   Hyperlipidemia 04/14/2015   Diverticulitis of large intestine without perforation or abscess with bleeding 09/30/2014   Palpitations suggestive of PVCs or PACs 10/16/2013   Sleep apnea 10/16/2013   A-fib (HCC) 05/15/2012   HTN (hypertension) 05/15/2012   Past Medical History:  Diagnosis Date   Anxiety    Arthritis    knees, left shoulder   Decreased testosterone level  11/07/2017   Diverticulitis of large intestine without perforation or abscess with bleeding 09/30/2014   HTN (hypertension)    Hyperlipidemia 04/14/2015   Lone atrial fibrillation (HCC)    a. Dx 04/2012 in ER. Event monitor 07/2012 for palpitations - pt triggers correlated with NSR only.   MVP (mitral valve prolapse)    a. Pt reported dx at age 42, NOT seen on echo. 2D echo 05/2012: EF 55-60%, no RWMA, grade 2 diastolic dysfunction, no significant valvular abnormalities.   PONV (postoperative nausea and vomiting)    Reduced libido 11/09/2017   Sleep apnea    uses CPAP nightly    Family History  Problem Relation Age of Onset   Hyperlipidemia Father    Stroke Maternal Grandmother    Heart attack Neg Hx     Past Surgical History:  Procedure Laterality Date   COLONOSCOPY     SHOULDER ARTHROSCOPY WITH DISTAL CLAVICLE RESECTION Left 07/27/2023   Procedure: DISTAL CLAVICLE EXCISION, EXTENSIVE DEBRIDEMENT;  Surgeon: Tarry Kos, MD;  Location: [facility];  Service: Orthopedics;  Laterality: Left;   SHOULDER ARTHROSCOPY WITH ROTATOR CUFF REPAIR AND SUBACROMIAL DECOMPRESSION Left 07/27/2023   Procedure: LEFT SHOULDER ARTHROSCOPY WITH ROTATOR CUFF REPAIR, SUBACROMIAL DECOMPRESSION;  Surgeon: Tarry Kos, MD;  Location:  SURGERY CENTER;  Service: Orthopedics;  Laterality: Left;   UMBILICAL HERNIA REPAIR     Social History   Occupational History   Not on file  Tobacco Use   Smoking status: Never   Smokeless tobacco: Never  Vaping Use   Vaping status: Never Used  Substance and Sexual Activity   Alcohol use: Yes    Comment: social   Drug use: No   Sexual activity: Not on file

## 2023-08-09 ENCOUNTER — Encounter: Payer: Self-pay | Admitting: Physical Therapy

## 2023-08-09 ENCOUNTER — Ambulatory Visit (INDEPENDENT_AMBULATORY_CARE_PROVIDER_SITE_OTHER): Payer: BC Managed Care – PPO | Admitting: Physical Therapy

## 2023-08-09 DIAGNOSIS — R600 Localized edema: Secondary | ICD-10-CM

## 2023-08-09 DIAGNOSIS — M6281 Muscle weakness (generalized): Secondary | ICD-10-CM | POA: Diagnosis not present

## 2023-08-09 DIAGNOSIS — M25512 Pain in left shoulder: Secondary | ICD-10-CM | POA: Diagnosis not present

## 2023-08-09 NOTE — Therapy (Signed)
OUTPATIENT PHYSICAL THERAPY SHOULDER EVALUATION   Patient Name: Jon Collins MRN: [id number] DOB:Jul 30, 1970, 53 y.o., male Today's Date: 08/09/2023  END OF SESSION:  PT End of Session - 08/09/23 0842     Visit Number 1    Number of Visits 20    Date for PT Re-Evaluation 11/01/23    Authorization Type [employer]    PT Start Time [phone]    PT Stop Time 0925    PT Time Calculation (min) 42 min    Activity Tolerance Patient tolerated treatment well    Behavior During Therapy [facility] for tasks assessed/performed             Past Medical History:  Diagnosis Date   Anxiety    Arthritis    knees, left shoulder   Decreased testosterone level 11/07/2017   Diverticulitis of large intestine without perforation or abscess with bleeding 09/30/2014   HTN (hypertension)    Hyperlipidemia 04/14/2015   Lone atrial fibrillation (HCC)    a. Dx 04/2012 in ER. Event monitor 07/2012 for palpitations - pt triggers correlated with NSR only.   MVP (mitral valve prolapse)    a. Pt reported dx at age 56, NOT seen on echo. 2D echo 05/2012: EF 55-60%, no RWMA, grade 2 diastolic dysfunction, no significant valvular abnormalities.   PONV (postoperative nausea and vomiting)    Reduced libido 11/09/2017   Sleep apnea    uses CPAP nightly   Past Surgical History:  Procedure Laterality Date   COLONOSCOPY     SHOULDER ARTHROSCOPY WITH DISTAL CLAVICLE RESECTION Left 07/27/2023   Procedure: DISTAL CLAVICLE EXCISION, EXTENSIVE DEBRIDEMENT;  Surgeon: Tarry Kos, MD;  Location: [facility];  Service: Orthopedics;  Laterality: Left;   SHOULDER ARTHROSCOPY WITH ROTATOR CUFF REPAIR AND SUBACROMIAL DECOMPRESSION Left 07/27/2023   Procedure: LEFT SHOULDER ARTHROSCOPY WITH ROTATOR CUFF REPAIR, SUBACROMIAL DECOMPRESSION;  Surgeon: Tarry Kos, MD;  Location: [facility];  Service: Orthopedics;  Laterality: Left;   UMBILICAL HERNIA REPAIR     Patient Active Problem List   Diagnosis Date  Noted   Complete tear of left rotator cuff 07/27/2023   Impingement syndrome of left shoulder 07/27/2023   Arthritis of left acromioclavicular joint 07/27/2023   Reduced libido 11/09/2017   Decreased testosterone level 11/07/2017   Mitral valve prolapse 01/14/2016   Hyperlipidemia 04/14/2015   Diverticulitis of large intestine without perforation or abscess with bleeding 09/30/2014   Palpitations suggestive of PVCs or PACs 10/16/2013   Sleep apnea 10/16/2013   A-fib (HCC) 05/15/2012   HTN (hypertension) 05/15/2012    PCP: [name], MD   REFERRING PROVIDER: Tarry Kos, MD   REFERRING DIAG: [phone] (ICD-10-CM) - Nontraumatic complete tear of left rotator cuff M75.42 (ICD-10-CM) - Impingement syndrome of left shoulder  THERAPY DIAG:  Acute pain of left shoulder - Plan: PT plan of care cert/re-cert  Muscle weakness (generalized) - Plan: PT plan of care cert/re-cert  Localized edema - Plan: PT plan of care cert/re-cert  Rationale for Evaluation and Treatment: Rehabilitation  ONSET DATE: S/p left rotator cuff repair 07/27/23  SUBJECTIVE:  SUBJECTIVE STATEMENT:He had nontraumatic RTC tear that was repaired. Pain has been low overall  Hand dominance: Right  PERTINENT HISTORY:S/p left rotator cuff repair 07/27/23 medium size protocol. 6 weeks post op will be 09/07/23    PAIN:  NPRS scale: 1 currently, can get up to 4/10 upon arrival Pain location:left shoulder back and front near incisions Pain description: constant, achy, sharp Aggravating factors: sneeze, changing clothes Relieving factors: rest, meds   PRECAUTIONS: Shoulder RTC medium size protocol  RED FLAGS: None   WEIGHT BEARING RESTRICTIONS: No  FALLS:  Has patient fallen in last 6 months? 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  PATIENT SURVEYS:  Eval: FOTO 45% functional, goal is 69%  COGNITION: Overall cognitive status: Within functional limits for tasks assessed     SENSATION: WFL  UPPER EXTREMITY ROM:   Active/PROM ROM Left eval Left   Shoulder flexion /90   Shoulder extension    Shoulder abduction /90   Shoulder adduction    Shoulder internal rotation /70 at 70 deg abd   Shoulder external rotation /30 at 70 deg abd   Elbow flexion    Elbow extension    Wrist flexion    Wrist extension    Wrist ulnar deviation    Wrist radial deviation    Wrist pronation    Wrist supination    (Blank rows = not tested)  UPPER EXTREMITY MMT:  MMT Left Eval Was not tested due to post op precautions Left  Shoulder flexion    Shoulder extension    Shoulder abduction    Shoulder adduction    Shoulder internal rotation    Shoulder external rotation    Middle trapezius    Lower trapezius    Elbow flexion    Elbow extension    Wrist flexion    Wrist extension    Wrist ulnar deviation    Wrist radial deviation    Wrist pronation    Wrist supination    Grip strength (lbs)    (Blank rows = not tested)   TODAY'S TREATMENT:  Eval HEP creation and review with demonstration and trial set preformed, see below for details Manual: Left shoulder PROM to tolerance in all planes    PATIENT EDUCATION: Education details: HEP, PT plan of care Person educated: Patient Education method: Explanation, Demonstration, Verbal cues, and Handouts Education comprehension: verbalized understanding and needs further education   HOME EXERCISE PROGRAM: Access Code: [email or web address] URL: [email or web address] Date: 08/09/2023 Prepared by: Ivery Quale  Exercises -  Seated Shoulder Flexion Towel Slide at Table Top  - 2-3 x daily - 6 x weekly - 1-2 sets - 10 reps - Seated Shoulder Abduction Towel Slide at Table Top  - 2-3 x daily - 6 x weekly - 1-2 sets - 10 reps - 5 hold - Seated Shoulder External Rotation PROM on Table  - 2-3 x daily - 6 x weekly - 1-2 sets - 10 reps - 5 hold - Circular Shoulder Pendulum with Table Support  - 2 x daily - 6 x weekly - 1 sets - 20 reps - Seated Scapular Retraction  - 2 x daily - 6 x weekly - 1 sets - 15 reps - 5 sec hold - Seated Gripping Towel  - 2 x daily -  6 x weekly - 2 sets - 2 reps  ASSESSMENT:  CLINICAL IMPRESSION: Patient referred to PT S/p left rotator cuff repair 07/27/23 medium size protocol. 6 weeks post op will be 09/07/23. We reviewed post op precautions with him of no AROM until 6 weeks post op. Patient will benefit from skilled PT to address below impairments, limitations and improve overall function.  OBJECTIVE IMPAIRMENTS: decreased activity tolerance, decreased shoulder mobility, decreased ROM, decreased strength, impaired flexibility, impaired UE use,  and pain.  ACTIVITY LIMITATIONS: reaching, lifting, carry,  cleaning, driving, and or occupation  PERSONAL FACTORS:  also affecting patient's functional outcome.  REHAB POTENTIAL: Good  CLINICAL DECISION MAKING: Stable/uncomplicated  EVALUATION COMPLEXITY: Low    GOALS: Short term PT Goals Target date: 09/06/2023   Pt will be I and compliant with HEP. Baseline:  Goal status: New Pt will improve Lt shoulder PROM to [facility] Baseline: Goal status: New  Long term PT goals Target date:11/01/2023   Pt will improve Lt shoulder AROM to [facility] to improve functional reaching Baseline: Goal status: New Pt will improve  Rt shoulder strength to at least 5/5 MMT to improve functional strength Baseline: Goal status: New Pt will improve FOTO to at least 69% functional to show improved function Baseline: Goal status: New Pt will reduce pain to overall less  than 3/10 with usual activity, work activity, and gym activity Baseline: Goal status: New  PLAN: PT FREQUENCY: 1-3 times per week   PT DURATION: 12 weeks  PLANNED INTERVENTIONS (unless contraindicated): aquatic PT, Canalith repositioning, cryotherapy, Electrical stimulation, Iontophoresis with 4 mg/ml dexamethasome, Moist heat, traction, Ultrasound, gait training, Therapeutic exercise, balance training, neuromuscular re-education, patient/family education, prosthetic training, manual techniques, passive ROM, dry needling, taping, vasopnuematic device, vestibular, spinal manipulations, joint manipulations 97110-Therapeutic exercises, 97530- Therapeutic activity, 97112- Neuromuscular re-education, 97535- Self Care, and 16109- Manual therapy  PLAN FOR NEXT SESSION: review HEP, restore PROM  S/p left rotator cuff repair 07/27/23 medium size protocol. 6 weeks post op will be 09/07/23 so can begin AROM then                                                                                                                             April Manson, PT,DPT 08/09/2023, 9:24 AM

## 2023-08-10 ENCOUNTER — Encounter: Payer: BC Managed Care – PPO | Admitting: Physical Therapy

## 2023-08-10 ENCOUNTER — Encounter: Payer: BC Managed Care – PPO | Admitting: Physical Medicine and Rehabilitation

## 2023-08-13 NOTE — Progress Notes (Unsigned)
Cardiology Office Note    Patient Name: Jon Collins Date of Encounter: 08/14/2023  Primary Care Provider:  [name], MD Primary Cardiologist:  Kristeen Miss, MD Primary Electrophysiologist: None   Past Medical History    Past Medical History:  Diagnosis Date   Anxiety    Arthritis    knees, left shoulder   Decreased testosterone level 11/07/2017   Diverticulitis of large intestine without perforation or abscess with bleeding 09/30/2014   HTN (hypertension)    Hyperlipidemia 04/14/2015   Lone atrial fibrillation (HCC)    a. Dx 04/2012 in ER. Event monitor 07/2012 for palpitations - pt triggers correlated with NSR only.   MVP (mitral valve prolapse)    a. Pt reported dx at age 75, NOT seen on echo. 2D echo 05/2012: EF 55-60%, no RWMA, grade 2 diastolic dysfunction, no significant valvular abnormalities.   PONV (postoperative nausea and vomiting)    Reduced libido 11/09/2017   Sleep apnea    uses CPAP nightly    History of Present Illness  Jon Collins is a 53 y.o. male with PMH of HTN, HLD, anxiety, PAF ,MVP OSA (on CPAP) who presents today for 1 year follow-up.   Jon Collins was initially seen by Dr. Elease Hashimoto and 2013 for evaluation of atrial fibrillation.  He is currently not on anticoagulation due to CHA2DS2-VASc of 1.  He had 2D echo completed on 07/15/2019 with EF of 60 to 65% and mildly increased LV wall thickness and no evidence of MV regurgitation.  He was last seen by Dr. Elease Hashimoto on 05/2021 and was doing well with no interval changes made and continued to take ASA 81 mg.  He underwent right shoulder arthroscopy on 07/27/23.  Jon Collins presents today for overall doing follow-up.  Ms. Collins reports that he has been doing well with no new cardiac complaints since his previous follow-up.  Recently completed right shoulder arthroscopic surgery is currently physical therapy. He reports no episodes of palpitations or shortness of breath since his last visit two and  a half years ago. He has been compliant with his medications and uses a CPAP machine regularly. He had a shoulder surgery recently and is currently in rehab, maintaining a good activity level despite the temporary limitations due to the surgery. He had discontinued aspirin on the advice of another doctor but is willing to restart it after discussing his risk factors with the current doctor.  Patient denies chest pain, palpitations, dyspnea, PND, orthopnea, nausea, vomiting, dizziness, syncope, edema, weight gain, or early satiety.   Review of Systems  Please see the history of present illness.    All other systems reviewed and are otherwise negative except as noted above.  Physical Exam    Wt Readings from Last 3 Encounters:  08/14/23 245 lb 12.8 oz (111.5 kg)  07/27/23 243 lb 6.2 oz (110.4 kg)  06/30/23 246 lb 9.6 oz (111.9 kg)   VS: Vitals:   08/14/23 0841  BP: 124/80  Pulse: 68  SpO2: 97%  ,Body mass index is 37.37 kg/m. GEN: Well nourished, well developed in no acute distress Neck: No JVD; No carotid bruits Pulmonary: Clear to auscultation without rales, wheezing or rhonchi  Cardiovascular: Normal rate. Regular rhythm. Normal S1. Normal S2.   Murmurs: Soft systolic murmur ABDOMEN: Soft, non-tender, non-distended EXTREMITIES:  No edema; No deformity   EKG/LABS/ Recent Cardiac Studies   ECG personally reviewed by me today -none completed today  Risk Assessment/Calculations:    CHA2DS2-VASc Score = 1  This indicates a 0.6% annual risk of stroke. The patient's score is based upon: CHF History: 0 HTN History: 1 Diabetes History: 0 Stroke History: 0 Vascular Disease History: 0 Age Score: 0 Gender Score: 0          Lab Results  Component Value Date   WBC WILL FOLLOW 05/31/2021   HGB WILL FOLLOW 05/31/2021   HCT WILL FOLLOW 05/31/2021   MCV WILL FOLLOW 05/31/2021   PLT WILL FOLLOW 05/31/2021   Lab Results  Component Value Date   CREATININE 1.01 05/31/2021    BUN 15 05/31/2021   NA 137 05/31/2021   K 4.0 05/31/2021   CL 97 05/31/2021   CO2 28 05/31/2021   Lab Results  Component Value Date   CHOL 173 05/31/2021   HDL 42 05/31/2021   LDLCALC 99 05/31/2021   LDLDIRECT 176.4 02/19/2013   TRIG 182 (H) 05/31/2021   CHOLHDL 4.1 05/31/2021    No results found for: "HGBA1C" Assessment & Plan    1.  Paroxysmal AF: -Patient is currently on rate control with carvedilol 18.75 mg twice daily and Tiazac 180 mg daily -Discussed the importance of stroke prevention given his risk factors (African American, male, history of Afib). -He had stopped his ASA 81 mg is willing to resume aspirin daily for stroke prevention. -CHA2DS2-VASc Score = 1 [CHF History: 0, HTN History: 1, Diabetes History: 0, Stroke History: 0, Vascular Disease History: 0, Age Score: 0, Gender Score: 0].  Therefore, the patient's annual risk of stroke is 0.6 %.       2.  Essential hypertension: -Patient's blood pressure today was stable at 124/80 -Continue carvedilol 18.75 mg twice daily, and Zestoretic 20-25 mg   3.  Hyperlipidemia: -Patient's last LDL cholesterol was 99 in 2956 -We will check LFTs and lipids today -continue Lipitor 20 mg   4.  Mitral valve prolapse: -2D echo was completed 2021 showing no significant valvular disease. -Repeat 2D echo for surveillance last completed in 2020  5. General Health Maintenance Recent shoulder surgery with ongoing rehabilitation. Maintaining physical activity with neighborhood walks. -Continue current physical activity as tolerated.  Disposition: Follow-up with Kristeen Miss, MD or APP as needed    Signed, Napoleon Form, Leodis Rains, NP 08/14/2023, 9:13 AM [facility]

## 2023-08-14 ENCOUNTER — Other Ambulatory Visit: Payer: Self-pay

## 2023-08-14 ENCOUNTER — Ambulatory Visit: Payer: BC Managed Care – PPO | Attending: Nurse Practitioner | Admitting: Nurse Practitioner

## 2023-08-14 ENCOUNTER — Encounter: Payer: Self-pay | Admitting: Nurse Practitioner

## 2023-08-14 ENCOUNTER — Telehealth: Payer: Self-pay | Admitting: Cardiovascular Disease

## 2023-08-14 VITALS — BP 124/80 | HR 68 | Ht 68.0 in | Wt 245.8 lb

## 2023-08-14 DIAGNOSIS — I341 Nonrheumatic mitral (valve) prolapse: Secondary | ICD-10-CM

## 2023-08-14 DIAGNOSIS — I480 Paroxysmal atrial fibrillation: Secondary | ICD-10-CM

## 2023-08-14 DIAGNOSIS — I10 Essential (primary) hypertension: Secondary | ICD-10-CM

## 2023-08-14 DIAGNOSIS — E782 Mixed hyperlipidemia: Secondary | ICD-10-CM | POA: Diagnosis not present

## 2023-08-14 DIAGNOSIS — Z0000 Encounter for general adult medical examination without abnormal findings: Secondary | ICD-10-CM

## 2023-08-14 LAB — HEPATIC FUNCTION PANEL
ALT: 39 [IU]/L (ref 0–44)
AST: 27 [IU]/L (ref 0–40)
Albumin: 4.2 g/dL (ref 3.8–4.9)
Alkaline Phosphatase: 65 [IU]/L (ref 44–121)
Bilirubin Total: 0.5 mg/dL (ref 0.0–1.2)
Bilirubin, Direct: 0.15 mg/dL (ref 0.00–0.40)
Total Protein: 7 g/dL (ref 6.0–8.5)

## 2023-08-14 LAB — LIPID PANEL
Chol/HDL Ratio: 3.9 {ratio} (ref 0.0–5.0)
Cholesterol, Total: 166 mg/dL (ref 100–199)
HDL: 43 mg/dL (ref >39)
LDL Chol Calc (NIH): 105 mg/dL — ABNORMAL HIGH (ref 0–99)
Triglycerides: 96 mg/dL (ref 0–149)
VLDL Cholesterol Cal: 18 mg/dL (ref 5–40)

## 2023-08-14 MED ORDER — ASPIRIN 81 MG PO TBEC: 81.0000 mg | DELAYED_RELEASE_TABLET | Freq: Every day | ORAL | Status: AC

## 2023-08-14 NOTE — Patient Instructions (Signed)
Medication Instructions:  Your physician recommends that you continue on your current medications as directed. Please refer to the Current Medication list given to you today.  *If you need a refill on your cardiac medications before your next appointment, please call your pharmacy*  Lab Work: Lipids and LFTs --- Today  If you have labs (blood work) drawn today and your tests are completely normal, you will receive your results only by: MyChart Message (if you have MyChart) OR A paper copy in the mail If you have any lab test that is abnormal or we need to change your treatment, we will call you to review the results.  Testing/Procedures: None ordered.  Follow-Up: At [facility], you and your health needs are our priority.  As part of our continuing mission to provide you with exceptional heart care, we have created designated Provider Care Teams.  These Care Teams include your primary Cardiologist (physician) and Advanced Practice Providers (APPs -  Physician Assistants and Nurse Practitioners) who all work together to provide you with the care you need, when you need it.   Your next appointment:   As needed  The format for your next appointment:   In Person  Provider:   Dr Melburn Popper  Important Information About Sugar

## 2023-08-14 NOTE — Telephone Encounter (Signed)
Patient saw Robin Searing today and states that he was told he was ordering an echo. I do not see an order, please advise.

## 2023-08-14 NOTE — Telephone Encounter (Signed)
Order placed for echo per Robin Searing, NP. Pt is aware.

## 2023-08-15 ENCOUNTER — Encounter: Payer: BC Managed Care – PPO | Admitting: Physical Therapy

## 2023-08-16 ENCOUNTER — Ambulatory Visit (HOSPITAL_COMMUNITY): Payer: BC Managed Care – PPO | Attending: Nurse Practitioner

## 2023-08-16 DIAGNOSIS — I341 Nonrheumatic mitral (valve) prolapse: Secondary | ICD-10-CM | POA: Diagnosis not present

## 2023-08-16 LAB — ECHOCARDIOGRAM COMPLETE
Area-P 1/2: 3.42 cm2
S' Lateral: 2.9 cm

## 2023-08-22 ENCOUNTER — Encounter: Payer: Self-pay | Admitting: Physical Therapy

## 2023-08-22 ENCOUNTER — Ambulatory Visit (INDEPENDENT_AMBULATORY_CARE_PROVIDER_SITE_OTHER): Payer: BC Managed Care – PPO | Admitting: Physical Therapy

## 2023-08-22 DIAGNOSIS — M25512 Pain in left shoulder: Secondary | ICD-10-CM | POA: Diagnosis not present

## 2023-08-22 DIAGNOSIS — M6281 Muscle weakness (generalized): Secondary | ICD-10-CM | POA: Diagnosis not present

## 2023-08-22 DIAGNOSIS — R600 Localized edema: Secondary | ICD-10-CM | POA: Diagnosis not present

## 2023-08-22 NOTE — Therapy (Signed)
OUTPATIENT PHYSICAL THERAPY SHOULDER   Patient Name: Jon Collins MRN: [id number] DOB:1971/03/29, 53 y.o., male Today's Date: 08/22/2023  END OF SESSION:  PT End of Session - 08/22/23 0858     Visit Number 2    Number of Visits 20    Date for PT Re-Evaluation 11/01/23    Authorization Type BCBS    PT Start Time 0847    PT Stop Time 0925    PT Time Calculation (min) 38 min    Activity Tolerance Patient tolerated treatment well    Behavior During Therapy [facility] for tasks assessed/performed              Past Medical History:  Diagnosis Date   Anxiety    Arthritis    knees, left shoulder   Decreased testosterone level 11/07/2017   Diverticulitis of large intestine without perforation or abscess with bleeding 09/30/2014   HTN (hypertension)    Hyperlipidemia 04/14/2015   Lone atrial fibrillation (HCC)    a. Dx 04/2012 in ER. Event monitor 07/2012 for palpitations - pt triggers correlated with NSR only.   MVP (mitral valve prolapse)    a. Pt reported dx at age 71, NOT seen on echo. 2D echo 05/2012: EF 55-60%, no RWMA, grade 2 diastolic dysfunction, no significant valvular abnormalities.   PONV (postoperative nausea and vomiting)    Reduced libido 11/09/2017   Sleep apnea    uses CPAP nightly   Past Surgical History:  Procedure Laterality Date   COLONOSCOPY     SHOULDER ARTHROSCOPY WITH DISTAL CLAVICLE RESECTION Left 07/27/2023   Procedure: DISTAL CLAVICLE EXCISION, EXTENSIVE DEBRIDEMENT;  Surgeon: Tarry Kos, MD;  Location: [facility];  Service: Orthopedics;  Laterality: Left;   SHOULDER ARTHROSCOPY WITH ROTATOR CUFF REPAIR AND SUBACROMIAL DECOMPRESSION Left 07/27/2023   Procedure: LEFT SHOULDER ARTHROSCOPY WITH ROTATOR CUFF REPAIR, SUBACROMIAL DECOMPRESSION;  Surgeon: Tarry Kos, MD;  Location: [facility];  Service: Orthopedics;  Laterality: Left;   UMBILICAL HERNIA REPAIR     Patient Active Problem List   Diagnosis Date Noted    Complete tear of left rotator cuff 07/27/2023   Impingement syndrome of left shoulder 07/27/2023   Arthritis of left acromioclavicular joint 07/27/2023   Reduced libido 11/09/2017   Decreased testosterone level 11/07/2017   Mitral valve prolapse 01/14/2016   Hyperlipidemia 04/14/2015   Diverticulitis of large intestine without perforation or abscess with bleeding 09/30/2014   Palpitations suggestive of PVCs or PACs 10/16/2013   Sleep apnea 10/16/2013   A-fib (HCC) 05/15/2012   HTN (hypertension) 05/15/2012    PCP: [name], MD   REFERRING PROVIDER: Tarry Kos, MD   REFERRING DIAG: [phone] (ICD-10-CM) - Nontraumatic complete tear of left rotator cuff M75.42 (ICD-10-CM) - Impingement syndrome of left shoulder  THERAPY DIAG:  Acute pain of left shoulder  Muscle weakness (generalized)  Localized edema  Rationale for Evaluation and Treatment: Rehabilitation  ONSET DATE: S/p left rotator cuff repair 07/27/23  SUBJECTIVE:  SUBJECTIVE STATEMENT: Pt reporting he has been working on his exercises but there have been times it's been a little more painful than others.    Hand dominance: Right  PERTINENT HISTORY:S/p left rotator cuff repair 07/27/23 medium size protocol. 6 weeks post op will be 09/07/23    PAIN:  NPRS scale:2-3/10 at rest, 5-6/10 with any movements Pain location:left shoulder back and front near incisions Pain description: constant, achy, sharp Aggravating factors: sneeze, changing clothes Relieving factors: rest, meds   PRECAUTIONS: Shoulder RTC medium size protocol  RED FLAGS: None   WEIGHT BEARING RESTRICTIONS: No  FALLS:  Has patient fallen in last 6 months? 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  PATIENT SURVEYS:  Eval: FOTO 45% functional, goal is 69%  COGNITION: Overall cognitive status: Within functional limits for tasks assessed     SENSATION: WFL  UPPER EXTREMITY ROM:   Active/PROM ROM Left eval Left   Shoulder flexion /90   Shoulder extension    Shoulder abduction /90   Shoulder adduction    Shoulder internal rotation /70 at 70 deg abd   Shoulder external rotation /30 at 70 deg abd   Elbow flexion    Elbow extension    Wrist flexion    Wrist extension    Wrist ulnar deviation    Wrist radial deviation    Wrist pronation    Wrist supination    (Blank rows = not tested)  UPPER EXTREMITY MMT:  MMT Left Eval Was not tested due to post op precautions Left  Shoulder flexion    Shoulder extension    Shoulder abduction    Shoulder adduction    Shoulder internal rotation    Shoulder external rotation    Middle trapezius    Lower trapezius    Elbow flexion    Elbow extension    Wrist flexion    Wrist extension    Wrist ulnar deviation    Wrist radial deviation    Wrist pronation    Wrist supination    Grip strength (lbs)    (Blank rows = not tested)   TODAY'S TREATMENT:  08/22/23: TherEx: Table slides: flexion, scaption, ER 2 x 10 holding 3 seconds each Pendulums: flex/ext, abd/add, circles both directions each x 1 minute Grip towel squeezes All HEP reviewed Manual: PROM left shoulder Gentle Grade 2-3 GH AP mobs  Vasopneumatic: 34 deg, x 10 minutes, low compression     Eval HEP creation and review with demonstration and trial set preformed, see below for details Manual: Left shoulder PROM to tolerance in all planes    PATIENT EDUCATION: Education details: HEP, PT plan of care Person educated: Patient Education method: Explanation, Demonstration, Verbal  cues, and Handouts Education comprehension: verbalized understanding and needs further education   HOME EXERCISE PROGRAM: Access Code: [email or web address] URL: [email or web address] Date: 08/09/2023 Prepared by: Ivery Quale  Exercises - Seated Shoulder Flexion Towel Slide at Table Top  - 2-3 x daily - 6 x weekly - 1-2 sets - 10 reps - Seated Shoulder Abduction Towel Slide at Table Top  - 2-3 x daily - 6 x weekly - 1-2 sets - 10 reps - 5 hold - Seated Shoulder External Rotation PROM on Table  - 2-3 x daily - 6 x weekly - 1-2  sets - 10 reps - 5 hold - Circular Shoulder Pendulum with Table Support  - 2 x daily - 6 x weekly - 1 sets - 20 reps - Seated Scapular Retraction  - 2 x daily - 6 x weekly - 1 sets - 15 reps - 5 sec hold - Seated Gripping Towel  - 2 x daily - 6 x weekly - 2 sets - 2 reps  ASSESSMENT:  CLINICAL IMPRESSION: 08/22/23: Pt arriving today reporting 3/10 at rest and 5-6/10 with any movements. Pt still wearing his sling. Pt's HEP was reviewed and pt able to demonstrate compliance. Continue skilled PT per pt's treatment plan.      EVAL:  Patient referred to PT S/p left rotator cuff repair 07/27/23 medium size protocol. 6 weeks post op will be 09/07/23. We reviewed post op precautions with him of no AROM until 6 weeks post op. Patient will benefit from skilled PT to address below impairments, limitations and improve overall function.  OBJECTIVE IMPAIRMENTS: decreased activity tolerance, decreased shoulder mobility, decreased ROM, decreased strength, impaired flexibility, impaired UE use,  and pain.  ACTIVITY LIMITATIONS: reaching, lifting, carry,  cleaning, driving, and or occupation  PERSONAL FACTORS:  also affecting patient's functional outcome.  REHAB POTENTIAL: Good  CLINICAL DECISION MAKING: Stable/uncomplicated  EVALUATION COMPLEXITY: Low    GOALS: Short term PT Goals Target date: 09/06/2023   Pt will be I and compliant with HEP. Baseline:  Goal  status: on-going 08/22/23  Pt will improve Lt shoulder PROM to [facility] Baseline: Goal status: on-going 08/22/23  Long term PT goals Target date:11/01/2023   Pt will improve Lt shoulder AROM to [facility] to improve functional reaching Baseline: Goal status: New Pt will improve  Rt shoulder strength to at least 5/5 MMT to improve functional strength Baseline: Goal status: New Pt will improve FOTO to at least 69% functional to show improved function Baseline: Goal status: New Pt will reduce pain to overall less than 3/10 with usual activity, work activity, and gym activity Baseline: Goal status: New  PLAN: PT FREQUENCY: 1-3 times per week   PT DURATION: 12 weeks  PLANNED INTERVENTIONS (unless contraindicated): aquatic PT, Canalith repositioning, cryotherapy, Electrical stimulation, Iontophoresis with 4 mg/ml dexamethasome, Moist heat, traction, Ultrasound, gait training, Therapeutic exercise, balance training, neuromuscular re-education, patient/family education, prosthetic training, manual techniques, passive ROM, dry needling, taping, vasopnuematic device, vestibular, spinal manipulations, joint manipulations 97110-Therapeutic exercises, 97530- Therapeutic activity, 97112- Neuromuscular re-education, 97535- Self Care, and 08657- Manual therapy  PLAN FOR NEXT SESSION: review HEP, restore PROM  S/p left rotator cuff repair 07/27/23 medium size protocol. 6 weeks post op will be 09/07/23 so can begin AROM then                                                                                                                             Sharmon Leyden, PT, MPT 08/22/2023, 9:03 AM

## 2023-08-25 ENCOUNTER — Encounter: Payer: BC Managed Care – PPO | Admitting: Rehabilitative and Restorative Service Providers"

## 2023-08-29 ENCOUNTER — Encounter: Payer: Self-pay | Admitting: Rehabilitative and Restorative Service Providers"

## 2023-08-29 ENCOUNTER — Ambulatory Visit (INDEPENDENT_AMBULATORY_CARE_PROVIDER_SITE_OTHER): Payer: BC Managed Care – PPO | Admitting: Rehabilitative and Restorative Service Providers"

## 2023-08-29 DIAGNOSIS — M6281 Muscle weakness (generalized): Secondary | ICD-10-CM

## 2023-08-29 DIAGNOSIS — R600 Localized edema: Secondary | ICD-10-CM | POA: Diagnosis not present

## 2023-08-29 DIAGNOSIS — M25512 Pain in left shoulder: Secondary | ICD-10-CM

## 2023-08-29 NOTE — Therapy (Signed)
OUTPATIENT PHYSICAL THERAPY TREATMENT  Patient Name: Fay Swider MRN: [id number] DOB:Dec 25, 1970, 54 y.o., male Today's Date: 08/29/2023  END OF SESSION:  PT End of Session - 08/29/23 0832     Visit Number 3    Number of Visits 20    Date for PT Re-Evaluation 11/01/23    Authorization Type BCBS    Progress Note Due on Visit 10    PT Start Time [phone]    PT Stop Time 0932    PT Time Calculation (min) 49 min    Activity Tolerance Patient tolerated treatment well    Behavior During Therapy [facility] for tasks assessed/performed               Past Medical History:  Diagnosis Date   Anxiety    Arthritis    knees, left shoulder   Decreased testosterone level 11/07/2017   Diverticulitis of large intestine without perforation or abscess with bleeding 09/30/2014   HTN (hypertension)    Hyperlipidemia 04/14/2015   Lone atrial fibrillation (HCC)    a. Dx 04/2012 in ER. Event monitor 07/2012 for palpitations - pt triggers correlated with NSR only.   MVP (mitral valve prolapse)    a. Pt reported dx at age 38, NOT seen on echo. 2D echo 05/2012: EF 55-60%, no RWMA, grade 2 diastolic dysfunction, no significant valvular abnormalities.   PONV (postoperative nausea and vomiting)    Reduced libido 11/09/2017   Sleep apnea    uses CPAP nightly   Past Surgical History:  Procedure Laterality Date   COLONOSCOPY     SHOULDER ARTHROSCOPY WITH DISTAL CLAVICLE RESECTION Left 07/27/2023   Procedure: DISTAL CLAVICLE EXCISION, EXTENSIVE DEBRIDEMENT;  Surgeon: Tarry Kos, MD;  Location: [facility];  Service: Orthopedics;  Laterality: Left;   SHOULDER ARTHROSCOPY WITH ROTATOR CUFF REPAIR AND SUBACROMIAL DECOMPRESSION Left 07/27/2023   Procedure: LEFT SHOULDER ARTHROSCOPY WITH ROTATOR CUFF REPAIR, SUBACROMIAL DECOMPRESSION;  Surgeon: Tarry Kos, MD;  Location: [facility];  Service: Orthopedics;  Laterality: Left;   UMBILICAL HERNIA REPAIR     Patient Active Problem  List   Diagnosis Date Noted   Complete tear of left rotator cuff 07/27/2023   Impingement syndrome of left shoulder 07/27/2023   Arthritis of left acromioclavicular joint 07/27/2023   Reduced libido 11/09/2017   Decreased testosterone level 11/07/2017   Mitral valve prolapse 01/14/2016   Hyperlipidemia 04/14/2015   Diverticulitis of large intestine without perforation or abscess with bleeding 09/30/2014   Palpitations suggestive of PVCs or PACs 10/16/2013   Sleep apnea 10/16/2013   A-fib (HCC) 05/15/2012   HTN (hypertension) 05/15/2012    PCP: [name], MD   REFERRING PROVIDER: Tarry Kos, MD   REFERRING DIAG: [phone] (ICD-10-CM) - Nontraumatic complete tear of left rotator cuff M75.42 (ICD-10-CM) - Impingement syndrome of left shoulder  THERAPY DIAG:  Acute pain of left shoulder  Muscle weakness (generalized)  Localized edema  Rationale for Evaluation and Treatment: Rehabilitation  ONSET DATE: S/p left rotator cuff repair 07/27/23  SUBJECTIVE:  SUBJECTIVE STATEMENT: Pt reporting he has been working on his exercises but there have been times it's been a little more painful than others.    Hand dominance: Right  PERTINENT HISTORY:S/p left rotator cuff repair 07/27/23 medium size protocol. 6 weeks post op will be 09/07/23    PAIN:  NPRS scale:2-3/10 at rest, 5-6/10 with any movements Pain location:left shoulder back and front near incisions Pain description: constant, achy, sharp Aggravating factors: sneeze, changing clothes Relieving factors: rest, meds   PRECAUTIONS: Shoulder RTC medium size protocol  RED FLAGS: None   WEIGHT BEARING RESTRICTIONS: No  FALLS:  Has patient fallen in last 6 months? 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  PATIENT SURVEYS: 08/09/2023  Eval: FOTO 45% functional, goal is 69%  COGNITION: 08/09/2023 Overall cognitive status: Within functional limits for tasks assessed     SENSATION: 08/09/2023 [facility]  UPPER EXTREMITY ROM:   Active/PROM ROM Left 08/09/2023 Left Supine PROM   Shoulder flexion /90 120  Shoulder extension    Shoulder abduction /90   Shoulder adduction    Shoulder internal rotation /70 at 70 deg abd   Shoulder external rotation /30 at 70 deg abd   Elbow flexion    Elbow extension    Wrist flexion    Wrist extension    Wrist ulnar deviation    Wrist radial deviation    Wrist pronation    Wrist supination    (Blank rows = not tested)  UPPER EXTREMITY MMT:  MMT Left 08/09/2023 Was not tested due to post op precautions Left  Shoulder flexion    Shoulder extension    Shoulder abduction    Shoulder adduction    Shoulder internal rotation    Shoulder external rotation    Middle trapezius    Lower trapezius    Elbow flexion    Elbow extension    Wrist flexion    Wrist extension    Wrist ulnar deviation    Wrist radial deviation    Wrist pronation    Wrist supination    Grip strength (lbs)    (Blank rows = not tested)                  TODAY'S TREATMENT:                                                                         DATE: 08/29/2023: Therex: Supine passive range flexion Lt with Rt arm moving it to tolerance 2-3 sec hold 2 x 10  Supine scapular retraction 5 sec hold into table x 15 Table slide passive shoulder flexion 2-3 sec hold x 15 Table slide passive scaption 2-3 sec hold x 15  Seated grip green webbing 5 sec hold , 2 mins  Seated finger spread green webbing 5 sec hold 2 mins    Manual: Supine Lt shoulder g3 inferior joint  mobs, distraction with passive range elevation in flexion, scaption and abduction.  Posterior glide c mobilization c movement ER to tolerance <  60 deg.    Vasopneumatic  Lt shoulder 10 mins low compression 34 degrees     TODAY'S TREATMENT:                                                                         DATE: 08/22/23: TherEx: Table slides: flexion, scaption, ER 2 x 10 holding 3 seconds each Pendulums: flex/ext, abd/add, circles both directions each x 1 minute Grip towel squeezes All HEP reviewed Manual: PROM left shoulder Gentle Grade 2-3 GH AP mobs  Vasopneumatic: 34 deg, x 10 minutes, low compression   TODAY'S TREATMENT:                                                                         DATE: 08/09/2023 Eval HEP creation and review with demonstration and trial set preformed, see below for details Manual: Left shoulder PROM to tolerance in all planes    PATIENT EDUCATION: Education details: HEP, PT plan of care Person educated: Patient Education method: Explanation, Demonstration, Verbal cues, and Handouts Education comprehension: verbalized understanding and needs further education   HOME EXERCISE PROGRAM: Access Code: [email or web address] URL: [email or web address] Date: 08/09/2023 Prepared by: Ivery Quale  Exercises - Seated Shoulder Flexion Towel Slide at Table Top  - 2-3 x daily - 6 x weekly - 1-2 sets - 10 reps - Seated Shoulder Abduction Towel Slide at Table Top  - 2-3 x daily - 6 x weekly - 1-2 sets - 10 reps - 5 hold - Seated Shoulder External Rotation PROM on Table  - 2-3 x daily - 6 x weekly - 1-2 sets - 10 reps - 5 hold - Circular Shoulder Pendulum with Table Support  - 2 x daily - 6 x weekly - 1 sets - 20 reps - Seated Scapular Retraction  - 2 x daily - 6 x weekly - 1 sets - 15 reps - 5 sec hold - Seated Gripping Towel  - 2 x daily - 6 x weekly - 2 sets - 2 reps  ASSESSMENT:  CLINICAL IMPRESSION: Pt indicated and demonstrated general  improvement in presentation with improved tolerance to passive elevation today vs previous measurement.  Continued skilled PT services indicated at this time with focus on passive range gains until 6 weeks post op.   OBJECTIVE IMPAIRMENTS: decreased activity tolerance, decreased shoulder mobility, decreased ROM, decreased strength, impaired flexibility, impaired UE use,  and pain.  ACTIVITY LIMITATIONS: reaching, lifting, carry,  cleaning, driving, and or occupation  PERSONAL FACTORS:  also affecting patient's functional outcome.  REHAB POTENTIAL: Good  CLINICAL DECISION MAKING: Stable/uncomplicated  EVALUATION COMPLEXITY: Low    GOALS: Short term PT Goals Target date: 09/06/2023   Pt will be I and compliant with HEP. Baseline:  Goal status: on-going 08/22/23  Pt will improve Lt shoulder PROM to [facility] Baseline: Goal status: on-going 08/22/23  Long term PT goals Target date:11/01/2023   Pt will improve Lt shoulder AROM to [facility] to improve functional reaching Baseline: Goal status:  New Pt will improve  Rt shoulder strength to at least 5/5 MMT to improve functional strength Baseline: Goal status: New Pt will improve FOTO to at least 69% functional to show improved function Baseline: Goal status: New Pt will reduce pain to overall less than 3/10 with usual activity, work activity, and gym activity Baseline: Goal status: New  PLAN: PT FREQUENCY: 1-3 times per week   PT DURATION: 12 weeks  PLANNED INTERVENTIONS (unless contraindicated): aquatic PT, Canalith repositioning, cryotherapy, Electrical stimulation, Iontophoresis with 4 mg/ml dexamethasome, Moist heat, traction, Ultrasound, gait training, Therapeutic exercise, balance training, neuromuscular re-education, patient/family education, prosthetic training, manual techniques, passive ROM, dry needling, taping, vasopnuematic device, vestibular, spinal manipulations, joint manipulations 97110-Therapeutic exercises, 97530-  Therapeutic activity, [id number]- Neuromuscular re-education, 97535- Self Care, and [id number]- Manual therapy  PLAN FOR NEXT SESSION:  Passive range gains.   S/p left rotator cuff repair 07/27/23 medium size protocol. 6 weeks post op will be 09/07/23 so can begin AROM then                                                                                                                             Chyrel Masson, PT, DPT, OCS, ATC 08/29/23  9:49 AM

## 2023-09-01 ENCOUNTER — Telehealth: Payer: Self-pay | Admitting: Orthopaedic Surgery

## 2023-09-01 ENCOUNTER — Encounter: Payer: BC Managed Care – PPO | Admitting: Rehabilitative and Restorative Service Providers"

## 2023-09-01 NOTE — Telephone Encounter (Signed)
 yes

## 2023-09-01 NOTE — Telephone Encounter (Signed)
Pt called requesting a work note stating he can return back to work with no restrictions after his 6 weeks of short term disability is up which will be 09/08/23, He should be able to return to his full time job as of 09/11/23 and a note for his part time job which will start 09/09/23. Best call back number [phone]

## 2023-09-01 NOTE — Telephone Encounter (Signed)
Spoke with patient. Scheduled his follow up appointment for next week. Also created both notes and emailed to [email or web address]

## 2023-09-06 ENCOUNTER — Encounter: Payer: Self-pay | Admitting: Physical Therapy

## 2023-09-06 ENCOUNTER — Telehealth: Payer: Self-pay | Admitting: Orthopaedic Surgery

## 2023-09-06 ENCOUNTER — Ambulatory Visit (INDEPENDENT_AMBULATORY_CARE_PROVIDER_SITE_OTHER): Payer: BC Managed Care – PPO | Admitting: Physical Therapy

## 2023-09-06 ENCOUNTER — Encounter: Payer: Self-pay | Admitting: Orthopaedic Surgery

## 2023-09-06 ENCOUNTER — Ambulatory Visit (INDEPENDENT_AMBULATORY_CARE_PROVIDER_SITE_OTHER): Payer: BC Managed Care – PPO | Admitting: Physician Assistant

## 2023-09-06 DIAGNOSIS — M6281 Muscle weakness (generalized): Secondary | ICD-10-CM | POA: Diagnosis not present

## 2023-09-06 DIAGNOSIS — M25512 Pain in left shoulder: Secondary | ICD-10-CM

## 2023-09-06 DIAGNOSIS — R600 Localized edema: Secondary | ICD-10-CM

## 2023-09-06 DIAGNOSIS — Z9889 Other specified postprocedural states: Secondary | ICD-10-CM

## 2023-09-06 NOTE — Therapy (Signed)
 OUTPATIENT PHYSICAL THERAPY TREATMENT  Patient Name: Jon Collins MRN: [id number] DOB:1971/04/06, 53 y.o., male Today's Date: 09/06/2023  END OF SESSION:  PT End of Session - 09/06/23 0850     Visit Number 4    Number of Visits 20    Date for PT Re-Evaluation 11/01/23    Authorization Type BCBS    Progress Note Due on Visit 10    PT Start Time 0845    PT Stop Time 0925    PT Time Calculation (min) 40 min    Activity Tolerance Patient tolerated treatment well    Behavior During Therapy [facility] for tasks assessed/performed               Past Medical History:  Diagnosis Date   Anxiety    Arthritis    knees, left shoulder   Decreased testosterone level 11/07/2017   Diverticulitis of large intestine without perforation or abscess with bleeding 09/30/2014   HTN (hypertension)    Hyperlipidemia 04/14/2015   Lone atrial fibrillation (HCC)    a. Dx 04/2012 in ER. Event monitor 07/2012 for palpitations - pt triggers correlated with NSR only.   MVP (mitral valve prolapse)    a. Pt reported dx at age 10, NOT seen on echo. 2D echo 05/2012: EF 55-60%, no RWMA, grade 2 diastolic dysfunction, no significant valvular abnormalities.   PONV (postoperative nausea and vomiting)    Reduced libido 11/09/2017   Sleep apnea    uses CPAP nightly   Past Surgical History:  Procedure Laterality Date   COLONOSCOPY     SHOULDER ARTHROSCOPY WITH DISTAL CLAVICLE RESECTION Left 07/27/2023   Procedure: DISTAL CLAVICLE EXCISION, EXTENSIVE DEBRIDEMENT;  Surgeon: Tarry Kos, MD;  Location: [facility];  Service: Orthopedics;  Laterality: Left;   SHOULDER ARTHROSCOPY WITH ROTATOR CUFF REPAIR AND SUBACROMIAL DECOMPRESSION Left 07/27/2023   Procedure: LEFT SHOULDER ARTHROSCOPY WITH ROTATOR CUFF REPAIR, SUBACROMIAL DECOMPRESSION;  Surgeon: Tarry Kos, MD;  Location: [facility];  Service: Orthopedics;  Laterality: Left;   UMBILICAL HERNIA REPAIR     Patient Active Problem  List   Diagnosis Date Noted   Complete tear of left rotator cuff 07/27/2023   Impingement syndrome of left shoulder 07/27/2023   Arthritis of left acromioclavicular joint 07/27/2023   Reduced libido 11/09/2017   Decreased testosterone level 11/07/2017   Mitral valve prolapse 01/14/2016   Hyperlipidemia 04/14/2015   Diverticulitis of large intestine without perforation or abscess with bleeding 09/30/2014   Palpitations suggestive of PVCs or PACs 10/16/2013   Sleep apnea 10/16/2013   A-fib (HCC) 05/15/2012   HTN (hypertension) 05/15/2012    PCP: [name], MD   REFERRING PROVIDER: Tarry Kos, MD   REFERRING DIAG: [phone] (ICD-10-CM) - Nontraumatic complete tear of left rotator cuff M75.42 (ICD-10-CM) - Impingement syndrome of left shoulder  THERAPY DIAG:  Acute pain of left shoulder  Muscle weakness (generalized)  Localized edema  Rationale for Evaluation and Treatment: Rehabilitation  ONSET DATE: S/p left rotator cuff repair 07/27/23  SUBJECTIVE:  SUBJECTIVE STATEMENT: Pt reporting he is getting better    Hand dominance: Right  PERTINENT HISTORY:S/p left rotator cuff repair 07/27/23 medium size protocol. 6 weeks post op will be 09/07/23    PAIN:  NPRS scale:0 at rest and 2/10 with any movements Pain location:left shoulder back and front near incisions Pain description: constant, achy, sharp Aggravating factors: sneeze, changing clothes Relieving factors: rest, meds   PRECAUTIONS: Shoulder RTC medium size protocol  RED FLAGS: None   WEIGHT BEARING RESTRICTIONS: No  FALLS:  Has patient fallen in last 6 months? 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  PATIENT SURVEYS: 08/09/2023  Eval: FOTO 45% functional, goal is 69%  COGNITION: 08/09/2023 Overall cognitive status: Within functional limits for tasks assessed     SENSATION: 08/09/2023 [facility]  UPPER EXTREMITY ROM:   Active/PROM ROM Left 08/09/2023 Left Supine PROM  Left 09/05/22 PROM  Shoulder flexion /90 120 140  Shoulder extension     Shoulder abduction /90  150  Shoulder adduction     Shoulder internal rotation /70 at 70 deg abd    Shoulder external rotation /30 at 70 deg abd  60 at 70 deg abd  Elbow flexion     Elbow extension     Wrist flexion     Wrist extension     Wrist ulnar deviation     Wrist radial deviation     Wrist pronation     Wrist supination     (Blank rows = not tested)  UPPER EXTREMITY MMT:  MMT Left 08/09/2023 Was not tested due to post op precautions Left  Shoulder flexion    Shoulder extension    Shoulder abduction    Shoulder adduction    Shoulder internal rotation    Shoulder external rotation    Middle trapezius    Lower trapezius    Elbow flexion    Elbow extension    Wrist flexion    Wrist extension    Wrist ulnar deviation    Wrist radial deviation    Wrist pronation    Wrist supination    Grip strength (lbs)    (Blank rows = not tested)                  TODAY'S TREATMENT:                                                                          DATE: 09/06/2023: Therex: Pulleys AAROM 2 min flexion, 2 min abduction UBE AAROM L1 X 3 min forward, 3 min retro Theractivity: for functional reaching ROM Wall ladder X 10 flexion, X 10 abduction Standing wand flexion AAROM bar vertical 1# bar X 15 Standing wand abuction AAROM bar vertical 1# bar X 15 Standing wand ER/IR AAROM bar vertical 1# bar X 15 Supine chest press AROM left UE X 10 Supine shoulder flexion AAROM hands clasped X10    DATE:  08/29/2023: Therex: Supine passive range flexion Lt with Rt arm moving it to tolerance 2-3  sec hold 2 x 10  Supine scapular retraction 5 sec hold into table x 15 Table slide passive shoulder flexion 2-3 sec hold x 15 Table slide passive scaption 2-3 sec hold x 15  Seated grip green webbing 5 sec hold , 2 mins  Seated finger spread green webbing 5 sec hold 2 mins    Manual: Supine Lt shoulder g3 inferior joint mobs, distraction with passive range elevation in flexion, scaption and abduction.  Posterior glide c mobilization c movement ER to tolerance < 60 deg.    Vasopneumatic  Lt shoulder 10 mins low compression 34 degrees     TODAY'S TREATMENT:                                                                         DATE: 08/22/23: TherEx: Table slides: flexion, scaption, ER 2 x 10 holding 3 seconds each Pendulums: flex/ext, abd/add, circles both directions each x 1 minute Grip towel squeezes All HEP reviewed Manual: PROM left shoulder Gentle Grade 2-3 GH AP mobs  Vasopneumatic: 34 deg, x 10 minutes, low compression   TODAY'S TREATMENT:                                                                         DATE: 08/09/2023 Eval HEP creation and review with demonstration and trial set preformed, see below for details Manual: Left shoulder PROM to tolerance in all planes    PATIENT EDUCATION: Education details: HEP, PT plan of care Person educated: Patient Education method: Explanation, Demonstration, Verbal cues, and Handouts Education comprehension: verbalized understanding and needs further education   HOME EXERCISE PROGRAM: Access Code: [email or web address] URL: [email or web address] Date: 08/09/2023 Prepared by: Ivery Quale  Exercises - Seated Shoulder Flexion Towel Slide at Table Top  - 2-3 x daily - 6 x weekly - 1-2 sets - 10 reps - Seated Shoulder Abduction Towel Slide at Table Top  - 2-3 x daily - 6 x weekly - 1-2 sets - 10 reps - 5 hold - Seated Shoulder  External Rotation PROM on Table  - 2-3 x daily - 6 x weekly - 1-2 sets - 10 reps - 5 hold - Circular Shoulder Pendulum with Table Support  - 2 x daily - 6 x weekly - 1 sets - 20 reps - Seated Scapular Retraction  - 2 x daily - 6 x weekly - 1 sets - 15 reps - 5 sec hold - Seated Gripping Towel  - 2 x daily - 6 x weekly - 2 sets - 2 reps  ASSESSMENT:  CLINICAL IMPRESSION: He is progressing well overall. He is now at the 6 week post op mark tomorrow so if MD grants him clearance we will plan to progress to AROM.  OBJECTIVE IMPAIRMENTS: decreased activity tolerance, decreased shoulder mobility, decreased ROM, decreased strength, impaired flexibility, impaired UE use,  and pain.  ACTIVITY LIMITATIONS: reaching, lifting, carry,  cleaning, driving, and or occupation  PERSONAL FACTORS:  also affecting patient's functional  outcome.  REHAB POTENTIAL: Good  CLINICAL DECISION MAKING: Stable/uncomplicated  EVALUATION COMPLEXITY: Low    GOALS: Short term PT Goals Target date: 09/06/2023   Pt will be I and compliant with HEP. Baseline:  Goal status: MET 09/06/23  Pt will improve Lt shoulder PROM to [facility] Baseline: Goal status: MET  09/06/23  Long term PT goals Target date:11/01/2023   Pt will improve Lt shoulder AROM to [facility] to improve functional reaching Baseline: Goal status:ongoing 09/06/23 Pt will improve  Rt shoulder strength to at least 5/5 MMT to improve functional strength Baseline: Goal status: ongoing 09/06/23 Pt will improve FOTO to at least 69% functional to show improved function Baseline: Goal status: ongoing 09/06/23 Pt will reduce pain to overall less than 3/10 with usual activity, work activity, and gym activity Baseline: Goal status: ongoing 09/06/23  PLAN: PT FREQUENCY: 1-3 times per week   PT DURATION: 12 weeks  PLANNED INTERVENTIONS (unless contraindicated): aquatic PT, Canalith repositioning, cryotherapy, Electrical stimulation, Iontophoresis with 4 mg/ml  dexamethasome, Moist heat, traction, Ultrasound, gait training, Therapeutic exercise, balance training, neuromuscular re-education, patient/family education, prosthetic training, manual techniques, passive ROM, dry needling, taping, vasopnuematic device, vestibular, spinal manipulations, joint manipulations 97110-Therapeutic exercises, 97530- Therapeutic activity, 97112- Neuromuscular re-education, 97535- Self Care, and 16109- Manual therapy  PLAN FOR NEXT SESSION:  Passive range gains.   S/p left rotator cuff repair 07/27/23 medium size protocol. 6 weeks post op will be 09/07/23 so can begin AROM then                                                                                                                            Ivery Quale, PT, DPT 09/06/23 8:53 AM

## 2023-09-06 NOTE — Telephone Encounter (Signed)
 Received $25 cash for form. To [employer].

## 2023-09-06 NOTE — Progress Notes (Signed)
 Post-Op Visit Note   Patient: Jon Collins           Date of Birth: 06-29-1971           MRN: [id number] Visit Date: 09/06/2023 PCP: [name], MD   Assessment & Plan:  Chief Complaint:  Chief Complaint  Patient presents with   Left Shoulder - Follow-up    Left shoulder scope 07/27/2023   Visit Diagnoses:  1. History of arthroscopy of left shoulder   2. S/P rotator cuff repair     Plan: Patient is a pleasant 53 year old gentleman who comes in today 6 weeks status post left shoulder arthroscopic debridement rotator cuff repair distal clavicle excision 07/27/2023.  He has been doing very well.  He is in minimal discomfort and takes very occasional Tylenol.  He has been compliant wearing his sling but notes it is in his car right now.  He is still in physical therapy making good progress.  Examination of the left shoulder reveals forward flexion to 140 degrees, abduction to 150 degrees.  External rotation to 60 degrees.  At this point, he will discontinue the use of his sling.  Continue with physical therapy.  No lifting more than a couple pounds to the left upper extremity.  Follow-up in 6 weeks for recheck.  Call with concerns or questions.  Follow-Up Instructions: Return in about 6 weeks (around 10/18/2023).   Orders:  No orders of the defined types were placed in this encounter.  No orders of the defined types were placed in this encounter.   Imaging: No new imaging  PMFS History: Patient Active Problem List   Diagnosis Date Noted   Complete tear of left rotator cuff 07/27/2023   Impingement syndrome of left shoulder 07/27/2023   Arthritis of left acromioclavicular joint 07/27/2023   Reduced libido 11/09/2017   Decreased testosterone level 11/07/2017   Mitral valve prolapse 01/14/2016   Hyperlipidemia 04/14/2015   Diverticulitis of large intestine without perforation or abscess with bleeding 09/30/2014   Palpitations suggestive of PVCs or PACs 10/16/2013    Sleep apnea 10/16/2013   A-fib (HCC) 05/15/2012   HTN (hypertension) 05/15/2012   Past Medical History:  Diagnosis Date   Anxiety    Arthritis    knees, left shoulder   Decreased testosterone level 11/07/2017   Diverticulitis of large intestine without perforation or abscess with bleeding 09/30/2014   HTN (hypertension)    Hyperlipidemia 04/14/2015   Lone atrial fibrillation (HCC)    a. Dx 04/2012 in ER. Event monitor 07/2012 for palpitations - pt triggers correlated with NSR only.   MVP (mitral valve prolapse)    a. Pt reported dx at age 60, NOT seen on echo. 2D echo 05/2012: EF 55-60%, no RWMA, grade 2 diastolic dysfunction, no significant valvular abnormalities.   PONV (postoperative nausea and vomiting)    Reduced libido 11/09/2017   Sleep apnea    uses CPAP nightly    Family History  Problem Relation Age of Onset   Hyperlipidemia Father    Stroke Maternal Grandmother    Heart attack Neg Hx     Past Surgical History:  Procedure Laterality Date   COLONOSCOPY     SHOULDER ARTHROSCOPY WITH DISTAL CLAVICLE RESECTION Left 07/27/2023   Procedure: DISTAL CLAVICLE EXCISION, EXTENSIVE DEBRIDEMENT;  Surgeon: Tarry Kos, MD;  Location: [facility];  Service: Orthopedics;  Laterality: Left;   SHOULDER ARTHROSCOPY WITH ROTATOR CUFF REPAIR AND SUBACROMIAL DECOMPRESSION Left 07/27/2023   Procedure: LEFT SHOULDER  ARTHROSCOPY WITH ROTATOR CUFF REPAIR, SUBACROMIAL DECOMPRESSION;  Surgeon: Tarry Kos, MD;  Location: [facility];  Service: Orthopedics;  Laterality: Left;   UMBILICAL HERNIA REPAIR     Social History   Occupational History   Not on file  Tobacco Use   Smoking status: Never   Smokeless tobacco: Never  Vaping Use   Vaping status: Never Used  Substance and Sexual Activity   Alcohol use: Yes    Comment: social   Drug use: No   Sexual activity: Not on file

## 2023-09-08 ENCOUNTER — Encounter: Payer: BC Managed Care – PPO | Admitting: Rehabilitative and Restorative Service Providers"

## 2023-09-13 ENCOUNTER — Encounter: Payer: BC Managed Care – PPO | Admitting: Physical Therapy

## 2023-09-14 ENCOUNTER — Encounter: Payer: BC Managed Care – PPO | Admitting: Physical Medicine and Rehabilitation

## 2023-09-15 ENCOUNTER — Encounter: Payer: BC Managed Care – PPO | Admitting: Rehabilitative and Restorative Service Providers"

## 2023-09-18 ENCOUNTER — Ambulatory Visit (INDEPENDENT_AMBULATORY_CARE_PROVIDER_SITE_OTHER): Admitting: Physical Therapy

## 2023-09-18 ENCOUNTER — Encounter: Payer: Self-pay | Admitting: Physical Therapy

## 2023-09-18 DIAGNOSIS — M25512 Pain in left shoulder: Secondary | ICD-10-CM | POA: Diagnosis not present

## 2023-09-18 DIAGNOSIS — M6281 Muscle weakness (generalized): Secondary | ICD-10-CM | POA: Diagnosis not present

## 2023-09-18 DIAGNOSIS — R600 Localized edema: Secondary | ICD-10-CM

## 2023-09-18 NOTE — Therapy (Signed)
 OUTPATIENT PHYSICAL THERAPY TREATMENT  Patient Name: Jon Collins MRN: [id number] DOB:12/18/1970, 53 y.o., male Today's Date: 09/18/2023  END OF SESSION:  PT End of Session - 09/18/23 0801     Visit Number 5    Number of Visits 20    Date for PT Re-Evaluation 11/01/23    Authorization Type BCBS    Progress Note Due on Visit 10    PT Start Time 0800    PT Stop Time 0840    PT Time Calculation (min) 40 min    Activity Tolerance Patient tolerated treatment well    Behavior During Therapy [facility] for tasks assessed/performed               Past Medical History:  Diagnosis Date   Anxiety    Arthritis    knees, left shoulder   Decreased testosterone level 11/07/2017   Diverticulitis of large intestine without perforation or abscess with bleeding 09/30/2014   HTN (hypertension)    Hyperlipidemia 04/14/2015   Lone atrial fibrillation (HCC)    a. Dx 04/2012 in ER. Event monitor 07/2012 for palpitations - pt triggers correlated with NSR only.   MVP (mitral valve prolapse)    a. Pt reported dx at age 37, NOT seen on echo. 2D echo 05/2012: EF 55-60%, no RWMA, grade 2 diastolic dysfunction, no significant valvular abnormalities.   PONV (postoperative nausea and vomiting)    Reduced libido 11/09/2017   Sleep apnea    uses CPAP nightly   Past Surgical History:  Procedure Laterality Date   COLONOSCOPY     SHOULDER ARTHROSCOPY WITH DISTAL CLAVICLE RESECTION Left 07/27/2023   Procedure: DISTAL CLAVICLE EXCISION, EXTENSIVE DEBRIDEMENT;  Surgeon: Tarry Kos, MD;  Location: [facility];  Service: Orthopedics;  Laterality: Left;   SHOULDER ARTHROSCOPY WITH ROTATOR CUFF REPAIR AND SUBACROMIAL DECOMPRESSION Left 07/27/2023   Procedure: LEFT SHOULDER ARTHROSCOPY WITH ROTATOR CUFF REPAIR, SUBACROMIAL DECOMPRESSION;  Surgeon: Tarry Kos, MD;  Location: [facility];  Service: Orthopedics;  Laterality: Left;   UMBILICAL HERNIA REPAIR     Patient Active Problem  List   Diagnosis Date Noted   Complete tear of left rotator cuff 07/27/2023   Impingement syndrome of left shoulder 07/27/2023   Arthritis of left acromioclavicular joint 07/27/2023   Reduced libido 11/09/2017   Decreased testosterone level 11/07/2017   Mitral valve prolapse 01/14/2016   Hyperlipidemia 04/14/2015   Diverticulitis of large intestine without perforation or abscess with bleeding 09/30/2014   Palpitations suggestive of PVCs or PACs 10/16/2013   Sleep apnea 10/16/2013   A-fib (HCC) 05/15/2012   HTN (hypertension) 05/15/2012    PCP: [name], MD   REFERRING PROVIDER: Tarry Kos, MD   REFERRING DIAG: [phone] (ICD-10-CM) - Nontraumatic complete tear of left rotator cuff M75.42 (ICD-10-CM) - Impingement syndrome of left shoulder  THERAPY DIAG:  Acute pain of left shoulder  Muscle weakness (generalized)  Localized edema  Rationale for Evaluation and Treatment: Rehabilitation  ONSET DATE: S/p left rotator cuff repair 07/27/23  SUBJECTIVE:  SUBJECTIVE STATEMENT: Pt reporting his shoulder is feeling good.   Hand dominance: Right  PERTINENT HISTORY:S/p left rotator cuff repair 07/27/23 medium size protocol. 6 weeks post op will be 09/07/23    PAIN:  NPRS scale:0 at rest and 2/10 with any movements Pain location:left shoulder back and front near incisions Pain description: constant, achy, sharp Aggravating factors: sneeze, changing clothes Relieving factors: rest, meds   PRECAUTIONS: Shoulder RTC medium size protocol  RED FLAGS: None   WEIGHT BEARING RESTRICTIONS: No  FALLS:  Has patient fallen in last 6 months? 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  PATIENT SURVEYS: 08/09/2023  Eval: FOTO 45% functional, goal is 69%  COGNITION: 08/09/2023 Overall cognitive status: Within functional limits for tasks assessed     SENSATION: 08/09/2023 [facility]  UPPER EXTREMITY ROM:   Active/PROM ROM Left 08/09/2023 Left Supine PROM  Left 09/05/22 PROM  Shoulder flexion /90 120 140  Shoulder extension     Shoulder abduction /90  150  Shoulder adduction     Shoulder internal rotation /70 at 70 deg abd    Shoulder external rotation /30 at 70 deg abd  60 at 70 deg abd  Elbow flexion     Elbow extension     Wrist flexion     Wrist extension     Wrist ulnar deviation     Wrist radial deviation     Wrist pronation     Wrist supination     (Blank rows = not tested)  UPPER EXTREMITY MMT:  MMT Left 08/09/2023 Was not tested due to post op precautions Left  Shoulder flexion    Shoulder extension    Shoulder abduction    Shoulder adduction    Shoulder internal rotation    Shoulder external rotation    Middle trapezius    Lower trapezius    Elbow flexion    Elbow extension    Wrist flexion    Wrist extension    Wrist ulnar deviation    Wrist radial deviation    Wrist pronation    Wrist supination    Grip strength (lbs)    (Blank rows = not tested)                  TODAY'S TREATMENT:                                                                          DATE: 09/18/2023: Therex: Pulleys AAROM 2 min flexion, 2 min abduction UBE AAROM L3 X 3 min forward, 3 min retro Theractivity: for functional reaching ROM UE ranger X 10 flexion, X 15 circles CW,CCW Lifting and reaching to top shelf in top cabinet, 2# 2X10 Shoulder abduction to 90 deg, 1# 2X10 Rows green 2X10 Shoulder extensions green 2X10 Shoulder ER greeen 2X10 Chest press standing, green 2X10 HEP progressed and review.   DATE:  09/06/2023: Therex: Pulleys AAROM 2 min flexion, 2 min abduction UBE AAROM L1 X 3 min forward, 3 min retro Theractivity: for  functional reaching ROM Wall ladder X 10 flexion, X 10 abduction Standing wand flexion AAROM bar vertical 1# bar X 15 Standing wand abuction AAROM bar vertical 1# bar X 15 Standing wand ER/IR AAROM bar vertical 1# bar X 15 Supine chest press AROM left UE X 10 Supine shoulder flexion AAROM hands clasped X10    PATIENT EDUCATION: Education details: HEP, PT plan of care Person educated: Patient Education method: Explanation, Demonstration, Verbal cues, and Handouts Education comprehension: verbalized understanding and needs further education   HOME EXERCISE PROGRAM: Access Code: [email or web address] URL: [email or web address] Date: 09/18/2023 Prepared by: Ivery Quale  Exercises - Circular Shoulder Pendulum with Table Support  - 2 x daily - 6 x weekly - 1 sets - 20 reps - Standing shoulder flexion wall slides  - 2 x daily - 6 x weekly - 1 sets - 10 reps - 3 sec hold - Standing Shoulder Row with Anchored Resistance  - 2 x daily - 6 x weekly - 2 sets - 10 reps - Shoulder extension with resistance - Neutral  - 2 x daily - 6 x weekly - 2 sets - 10 reps - Chest Press with Resistance  - 1 x daily - 6 x weekly - 2 sets - 10 reps - Standing Shoulder Flexion with Resistance  - 2 x daily - 6 x weekly - 2 sets - 10 reps - Standing Single Arm Shoulder Abduction with Resistance  - 2 x daily - 6 x weekly - 2 sets - 10 reps - Shoulder External Rotation with Anchored Resistance  - 2 x daily - 6 x weekly - 2 sets - 10 reps  ASSESSMENT:  CLINICAL IMPRESSION: He is doing very well up to this point and is beyond 6 weeks post op so did progress into strength work today and he had good tolerance to this so I did add this into his HEP now.  OBJECTIVE IMPAIRMENTS: decreased activity tolerance, decreased shoulder mobility, decreased ROM, decreased strength, impaired flexibility,  impaired UE use,  and pain.  ACTIVITY LIMITATIONS: reaching, lifting, carry,  cleaning, driving, and or occupation  PERSONAL FACTORS:  also affecting patient's functional outcome.  REHAB POTENTIAL: Good  CLINICAL DECISION MAKING: Stable/uncomplicated  EVALUATION COMPLEXITY: Low    GOALS: Short term PT Goals Target date: 09/06/2023   Pt will be I and compliant with HEP. Baseline:  Goal status: MET 09/06/23  Pt will improve Lt shoulder PROM to [facility] Baseline: Goal status: MET  09/06/23  Long term PT goals Target date:11/01/2023   Pt will improve Lt shoulder AROM to [facility] to improve functional reaching Baseline: Goal status:ongoing 09/06/23 Pt will improve  Rt shoulder strength to at least 5/5 MMT to improve functional strength Baseline: Goal status: ongoing 09/06/23 Pt will improve FOTO to at least 69% functional to show improved function Baseline: Goal status: ongoing 09/06/23 Pt will reduce pain to overall less than 3/10 with usual activity, work activity, and gym activity Baseline: Goal status: ongoing 09/06/23  PLAN: PT FREQUENCY: 1-3 times per week   PT DURATION: 12 weeks  PLANNED INTERVENTIONS (unless contraindicated): aquatic PT, Canalith repositioning, cryotherapy, Electrical stimulation, Iontophoresis with 4 mg/ml dexamethasome, Moist heat, traction, Ultrasound, gait training, Therapeutic exercise, balance training, neuromuscular re-education, patient/family education, prosthetic training, manual techniques, passive ROM, dry needling, taping, vasopnuematic device, vestibular, spinal manipulations, joint manipulations 97110-Therapeutic exercises, 97530- Therapeutic activity, 97112- Neuromuscular re-education, 97535- Self Care, and 54098- Manual therapy  PLAN FOR NEXT SESSION:  Progress strength as tolerated.  Ivery Quale, PT, DPT 09/18/23 8:02  AM

## 2023-09-29 ENCOUNTER — Encounter: Payer: Self-pay | Admitting: Physical Therapy

## 2023-09-29 ENCOUNTER — Ambulatory Visit: Admitting: Physical Therapy

## 2023-09-29 DIAGNOSIS — R600 Localized edema: Secondary | ICD-10-CM | POA: Diagnosis not present

## 2023-09-29 DIAGNOSIS — M25512 Pain in left shoulder: Secondary | ICD-10-CM | POA: Diagnosis not present

## 2023-09-29 DIAGNOSIS — M6281 Muscle weakness (generalized): Secondary | ICD-10-CM | POA: Diagnosis not present

## 2023-09-29 NOTE — Therapy (Signed)
 OUTPATIENT PHYSICAL THERAPY TREATMENT  Patient Name: Jon Collins MRN: [id number] DOB:05/20/1971, 53 y.o., male Today's Date: 09/29/2023  END OF SESSION:  PT End of Session - 09/29/23 1534     Visit Number 6    Number of Visits 20    Date for PT Re-Evaluation 11/01/23    Authorization Type BCBS    Progress Note Due on Visit 10    PT Start Time 1520    PT Stop Time 1558    PT Time Calculation (min) 38 min    Activity Tolerance Patient tolerated treatment well    Behavior During Therapy WFL for tasks assessed/performed                Past Medical History:  Diagnosis Date   Anxiety    Arthritis    knees, left shoulder   Decreased testosterone level 11/07/2017   Diverticulitis of large intestine without perforation or abscess with bleeding 09/30/2014   HTN (hypertension)    Hyperlipidemia 04/14/2015   Lone atrial fibrillation (HCC)    a. Dx 04/2012 in ER. Event monitor 07/2012 for palpitations - pt triggers correlated with NSR only.   MVP (mitral valve prolapse)    a. Pt reported dx at age 62, NOT seen on echo. 2D echo 05/2012: EF 55-60%, no RWMA, grade 2 diastolic dysfunction, no significant valvular abnormalities.   PONV (postoperative nausea and vomiting)    Reduced libido 11/09/2017   Sleep apnea    uses CPAP nightly   Past Surgical History:  Procedure Laterality Date   COLONOSCOPY     SHOULDER ARTHROSCOPY WITH DISTAL CLAVICLE RESECTION Left 07/27/2023   Procedure: DISTAL CLAVICLE EXCISION, EXTENSIVE DEBRIDEMENT;  Surgeon: Tarry Kos, MD;  Location: [facility];  Service: Orthopedics;  Laterality: Left;   SHOULDER ARTHROSCOPY WITH ROTATOR CUFF REPAIR AND SUBACROMIAL DECOMPRESSION Left 07/27/2023   Procedure: LEFT SHOULDER ARTHROSCOPY WITH ROTATOR CUFF REPAIR, SUBACROMIAL DECOMPRESSION;  Surgeon: Tarry Kos, MD;  Location: [facility];  Service: Orthopedics;  Laterality: Left;   UMBILICAL HERNIA REPAIR     Patient Active  Problem List   Diagnosis Date Noted   Complete tear of left rotator cuff 07/27/2023   Impingement syndrome of left shoulder 07/27/2023   Arthritis of left acromioclavicular joint 07/27/2023   Reduced libido 11/09/2017   Decreased testosterone level 11/07/2017   Mitral valve prolapse 01/14/2016   Hyperlipidemia 04/14/2015   Diverticulitis of large intestine without perforation or abscess with bleeding 09/30/2014   Palpitations suggestive of PVCs or PACs 10/16/2013   Sleep apnea 10/16/2013   A-fib (HCC) 05/15/2012   HTN (hypertension) 05/15/2012    PCP: [name], MD   REFERRING PROVIDER: Tarry Kos, MD   REFERRING DIAG: [phone] (ICD-10-CM) - Nontraumatic complete tear of left rotator cuff M75.42 (ICD-10-CM) - Impingement syndrome of left shoulder  THERAPY DIAG:  Acute pain of left shoulder  Localized edema  Muscle weakness (generalized)  Rationale for Evaluation and Treatment: Rehabilitation  ONSET DATE: S/p left rotator cuff repair 07/27/23  SUBJECTIVE:  SUBJECTIVE STATEMENT:  Feeling good, still trying to get this shoulder in good shape    Hand dominance: Right  PERTINENT HISTORY:S/p left rotator cuff repair 07/27/23 medium size protocol. 6 weeks post op will be 09/07/23    PAIN:  NPRS scale:  0/10 now, still having aches with certain movements  Pain location: Pain description:  Aggravating factors:  Relieving factors:    PRECAUTIONS: Shoulder RTC medium size protocol  RED FLAGS: None   WEIGHT BEARING RESTRICTIONS: No  FALLS:  Has patient fallen in last 6 months? 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  PATIENT SURVEYS: 08/09/2023  Eval: FOTO 45% functional, goal is 69%  COGNITION: 08/09/2023 Overall cognitive status: Within functional limits for tasks assessed     SENSATION: 08/09/2023 [facility]  UPPER EXTREMITY ROM:   Active/PROM ROM Left 08/09/2023 Left Supine PROM  Left 09/05/22 PROM Left 09/29/23 AROM   Shoulder flexion /90 120 140 158*  Shoulder extension      Shoulder abduction /90  150 139*  Shoulder adduction      Shoulder internal rotation /70 at 70 deg abd   L5   Shoulder external rotation /30 at 70 deg abd  60 at 70 deg abd C4   Elbow flexion      Elbow extension      Wrist flexion      Wrist extension      Wrist ulnar deviation      Wrist radial deviation      Wrist pronation      Wrist supination      (Blank rows = not tested)  UPPER EXTREMITY MMT:  MMT Left 08/09/2023 Was not tested due to post op precautions Left  Shoulder flexion    Shoulder extension    Shoulder abduction    Shoulder adduction    Shoulder internal rotation    Shoulder external rotation    Middle trapezius    Lower trapezius    Elbow flexion    Elbow extension    Wrist flexion    Wrist extension    Wrist ulnar deviation    Wrist radial deviation    Wrist pronation    Wrist supination    Grip strength (lbs)    (Blank rows = not tested)                  TODAY'S TREATMENT:                                                                          DATE:    09/29/23   UBE L3.5 x4 min forward/4 min backwards ROM check- see above Rows green TB x15  Shoulder extensions green TB x15  Scaption on wall ladder 15x3 second holds  Standing Ys 2# x10  Standing Ts 2# x10  Standing Is 2# x10  UE reacher CW/CCW circles x20 each way  UE reacher alphabet x2 rounds with break in between  Supine shoulder serratus punch 2# x15 Supine shoulder serratus punch circles  2x10 2#  Supine shoulder serratus alphabet  2# x2 Standing shoulder flexion 2# 2x10  Standing shoulder ABD 2# 2x10  Backwards shoulder rolls x20  Thoracic flexion/extension ROM x12        09/18/2023: Therex: Pulleys AAROM 2 min flexion, 2 min abduction UBE AAROM L3 X 3 min forward, 3 min retro Theractivity: for functional reaching ROM UE ranger X 10 flexion, X 15 circles CW,CCW Lifting and reaching to top shelf in top cabinet, 2# 2X10 Shoulder abduction to 90 deg, 1# 2X10 Rows green 2X10 Shoulder extensions green 2X10 Shoulder ER greeen 2X10 Chest press standing, green 2X10 HEP progressed and review.   DATE: 09/06/2023: Therex: Pulleys AAROM 2 min flexion, 2 min abduction UBE AAROM L1 X 3 min forward, 3 min retro Theractivity: for functional reaching ROM Wall ladder X 10 flexion, X 10 abduction Standing wand flexion AAROM bar vertical 1# bar X 15 Standing wand abuction AAROM bar vertical 1# bar X 15 Standing wand ER/IR AAROM bar vertical 1# bar X 15 Supine chest press AROM left UE X 10 Supine shoulder flexion AAROM hands clasped X10    PATIENT EDUCATION: Education details: HEP, PT plan of care Person educated: Patient Education method: Explanation, Demonstration, Verbal cues, and Handouts Education comprehension: verbalized understanding and needs further education   HOME EXERCISE PROGRAM: Access Code: [email or web address] URL: [email or web address] Date: 09/18/2023 Prepared by: Ivery Quale  Exercises - Circular Shoulder Pendulum with Table Support  - 2 x daily - 6 x weekly - 1 sets - 20 reps - Standing shoulder flexion wall slides  - 2 x daily - 6 x weekly - 1 sets - 10 reps - 3 sec hold - Standing Shoulder Row with Anchored Resistance  - 2 x daily - 6 x weekly - 2 sets - 10 reps - Shoulder extension with resistance - Neutral  - 2 x daily - 6 x weekly - 2 sets - 10 reps - Chest Press with Resistance  - 1 x daily - 6 x weekly - 2 sets - 10 reps - Standing Shoulder Flexion with Resistance  - 2 x daily -  6 x weekly - 2 sets - 10 reps - Standing Single Arm Shoulder Abduction with Resistance  - 2 x daily - 6 x weekly - 2 sets - 10 reps - Shoulder External Rotation with Anchored Resistance  - 2 x daily - 6 x weekly - 2 sets - 10 reps  ASSESSMENT:  CLINICAL IMPRESSION:   Pt arrives today really feeling well, encouraged him to make more appts as this was his last scheduled visit. Primary focus was strength today as per POC. Did really well, fatigued but had no increase in pain today but did notice that L shoulder muscles do continue to fatigue easily. Very motivated to improve and compliant with HEP, will continue efforts.    OBJECTIVE IMPAIRMENTS: decreased activity tolerance, decreased shoulder mobility, decreased ROM, decreased strength, impaired flexibility, impaired UE use,  and pain.  ACTIVITY LIMITATIONS: reaching, lifting, carry,  cleaning, driving, and or occupation  PERSONAL FACTORS:  also affecting patient's functional outcome.  REHAB POTENTIAL: Good  CLINICAL DECISION MAKING: Stable/uncomplicated  EVALUATION COMPLEXITY: Low    GOALS: Short term PT Goals Target date: 09/06/2023   Pt will be I and compliant with HEP. Baseline:  Goal status: MET 09/06/23  Pt will improve Lt shoulder PROM to [facility] Baseline: Goal status: MET  09/06/23  Long term PT goals Target date:11/01/2023   Pt will improve Lt shoulder AROM  to [facility] to improve functional reaching Baseline: Goal status:ongoing 09/06/23 Pt will improve  Rt shoulder strength to at least 5/5 MMT to improve functional strength Baseline: Goal status: ongoing 09/06/23 Pt will improve FOTO to at least 69% functional to show improved function Baseline: Goal status: ongoing 09/06/23 Pt will reduce pain to overall less than 3/10 with usual activity, work activity, and gym activity Baseline: Goal status: ongoing 09/06/23  PLAN: PT FREQUENCY: 1-3 times per week   PT DURATION: 12 weeks  PLANNED INTERVENTIONS (unless  contraindicated): aquatic PT, Canalith repositioning, cryotherapy, Electrical stimulation, Iontophoresis with 4 mg/ml dexamethasome, Moist heat, traction, Ultrasound, gait training, Therapeutic exercise, balance training, neuromuscular re-education, patient/family education, prosthetic training, manual techniques, passive ROM, dry needling, taping, vasopnuematic device, vestibular, spinal manipulations, joint manipulations 97110-Therapeutic exercises, 97530- Therapeutic activity, 97112- Neuromuscular re-education, 97535- Self Care, and 16109- Manual therapy  PLAN FOR NEXT SESSION:  Progress strength as tolerated. Did he schedule additional sessions?                                                  Nedra Hai, PT, DPT 09/29/23 3:58 PM

## 2023-10-06 ENCOUNTER — Ambulatory Visit (INDEPENDENT_AMBULATORY_CARE_PROVIDER_SITE_OTHER): Payer: Self-pay | Admitting: Rehabilitative and Restorative Service Providers"

## 2023-10-06 ENCOUNTER — Encounter: Payer: Self-pay | Admitting: Rehabilitative and Restorative Service Providers"

## 2023-10-06 DIAGNOSIS — R600 Localized edema: Secondary | ICD-10-CM

## 2023-10-06 DIAGNOSIS — M25512 Pain in left shoulder: Secondary | ICD-10-CM | POA: Diagnosis not present

## 2023-10-06 DIAGNOSIS — M6281 Muscle weakness (generalized): Secondary | ICD-10-CM | POA: Diagnosis not present

## 2023-10-06 NOTE — Therapy (Signed)
 OUTPATIENT PHYSICAL THERAPY TREATMENT  Patient Name: Jon Collins MRN: [id number] DOB:[birth year], 53 y.o., male Today's Date: 10/06/2023  END OF SESSION:  PT End of Session - 10/06/23 1516     Visit Number 7    Number of Visits 20    Date for PT Re-Evaluation 11/01/23    Authorization Type BCBS    Progress Note Due on Visit 10    PT Start Time 1516    PT Stop Time 1601    PT Time Calculation (min) 45 min    Activity Tolerance Patient tolerated treatment well;No increased pain    Behavior During Therapy WFL for tasks assessed/performed             Past Medical History:  Diagnosis Date   Anxiety    Arthritis    knees, left shoulder   Decreased testosterone level 11/07/2017   Diverticulitis of large intestine without perforation or abscess with bleeding 09/30/2014   HTN (hypertension)    Hyperlipidemia 04/14/2015   Lone atrial fibrillation (HCC)    a. Dx 04/2012 in ER. Event monitor 07/2012 for palpitations - pt triggers correlated with NSR only.   MVP (mitral valve prolapse)    a. Pt reported dx at age 64, NOT seen on echo. 2D echo 05/2012: EF 55-60%, no RWMA, grade 2 diastolic dysfunction, no significant valvular abnormalities.   PONV (postoperative nausea and vomiting)    Reduced libido 11/09/2017   Sleep apnea    uses CPAP nightly   Past Surgical History:  Procedure Laterality Date   COLONOSCOPY     SHOULDER ARTHROSCOPY WITH DISTAL CLAVICLE RESECTION Left 07/27/2023   Procedure: DISTAL CLAVICLE EXCISION, EXTENSIVE DEBRIDEMENT;  Surgeon: Tarry Kos, MD;  Location: [facility];  Service: Orthopedics;  Laterality: Left;   SHOULDER ARTHROSCOPY WITH ROTATOR CUFF REPAIR AND SUBACROMIAL DECOMPRESSION Left 07/27/2023   Procedure: LEFT SHOULDER ARTHROSCOPY WITH ROTATOR CUFF REPAIR, SUBACROMIAL DECOMPRESSION;  Surgeon: Tarry Kos, MD;  Location: [facility];  Service: Orthopedics;  Laterality: Left;   UMBILICAL HERNIA REPAIR     Patient  Active Problem List   Diagnosis Date Noted   Complete tear of left rotator cuff 07/27/2023   Impingement syndrome of left shoulder 07/27/2023   Arthritis of left acromioclavicular joint 07/27/2023   Reduced libido 11/09/2017   Decreased testosterone level 11/07/2017   Mitral valve prolapse 01/14/2016   Hyperlipidemia 04/14/2015   Diverticulitis of large intestine without perforation or abscess with bleeding 09/30/2014   Palpitations suggestive of PVCs or PACs 10/16/2013   Sleep apnea 10/16/2013   A-fib (HCC) 05/15/2012   HTN (hypertension) 05/15/2012    PCP: Jon Collin, MD   REFERRING PROVIDER: Tarry Kos, MD   REFERRING DIAG: [phone] (ICD-10-CM) - Nontraumatic complete tear of left rotator cuff M75.42 (ICD-10-CM) - Impingement syndrome of left shoulder  THERAPY DIAG:  Acute pain of left shoulder  Localized edema  Muscle weakness (generalized)  Rationale for Evaluation and Treatment: Rehabilitation  ONSET DATE: S/p left rotator cuff repair 07/27/23  SUBJECTIVE:  SUBJECTIVE STATEMENT: Jon Collins is starting week 11 post-surgery (rotator cuff repair) repair today.  He is getting 6+ hours uninterrupted sleep.    Hand dominance: Right  PERTINENT HISTORY:S/p left rotator cuff repair 07/27/23 medium size protocol. 6 weeks post op will be 09/07/23    PAIN:  NPRS scale: 0-3/10 this week Pain location: Left shoulder Pain description: Soreness but can get a quick jolt Aggravating factors: Getting undressed, late in the day Relieving factors: Rest, exercise   PRECAUTIONS: Shoulder RTC medium size protocol  RED FLAGS: None   WEIGHT BEARING RESTRICTIONS: No  FALLS:  Has patient fallen in last 6 months? 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  PATIENT SURVEYS: 08/09/2023  Eval: FOTO 45% functional, goal is 69%  COGNITION: 08/09/2023 Overall cognitive status: Within functional limits for tasks assessed     SENSATION: 08/09/2023 [facility]  UPPER EXTREMITY ROM:   Active/PROM ROM Left 08/09/2023 Left Supine PROM  Left 09/05/22 PROM Left 09/29/23 AROM  Left/Right 10/06/2023  Shoulder flexion /90 120 140 158* 155/180  Shoulder extension       Shoulder abduction /90  150 139*   Shoulder horizontal adduction     15/35  Shoulder internal rotation /70 at 70 deg abd   L5  55/50  Shoulder external rotation /30 at 70 deg abd  60 at 70 deg abd C4  90/110  Elbow flexion       Elbow extension       Wrist flexion       Wrist extension       Wrist ulnar deviation       Wrist radial deviation       Wrist pronation       Wrist supination       (Blank rows = not tested)  UPPER EXTREMITY MMT:  MMT Left 08/09/2023 Was not tested due to post op precautions Left/Right 10/06/2023  Shoulder flexion    Shoulder extension    Shoulder abduction    Shoulder adduction    Shoulder internal rotation  IR 28.1/47.6  Shoulder external rotation  ER 18.9/42.5  Middle trapezius    Lower trapezius    Elbow flexion    Elbow extension    Wrist flexion    Wrist extension    Wrist ulnar deviation    Wrist radial deviation    Wrist pronation    Wrist supination    Grip strength (lbs)    (Blank rows = not tested)                  TODAY'S TREATMENT:                                                                          DATE:  10/06/2023 Scapular protraction/supine arm raises 3# 20 x 3 seconds Posterior capsule stretch 10 x 10 seconds Pull to chest 20 x 3 seconds, short range, emphasize shoulder pinch, slow eccentrics Blue  Thera-Band Thera-Band Green ER 2 sets of 10, slow eccentrics Thera-Band Green  IR 10 x slow eccntrics  Functional Activities: Reviewed objective measures obtained today for active range of motion and strength.  Discussed healing times post rotator cuff repair (9 to 12 months) and recommended he discuss return to work with Dr. Roda Shutters as he will be the one releasing him for return to work.  Also discussed with Jon Collins the importance of avoiding lifting with the elbow extended away from the body and to try to keep objects close when returning to work.  Reviewed shoulder anatomy with the model and discussed his surgery and long-term recommendations for avoiding future problems.   09/29/23 UBE L3.5 x4 min forward/4 min backwards ROM check- see above Rows green TB x15  Shoulder extensions green TB x15  Scaption on wall ladder 15x3 second holds  Standing Ys 2# x10  Standing Ts 2# x10  Standing Is 2# x10  UE reacher CW/CCW circles x20 each way  UE reacher alphabet x2 rounds with break in between  Supine shoulder serratus punch 2# x15 Supine shoulder serratus punch circles 2x10 2#  Supine shoulder serratus alphabet 2# x2 Standing shoulder flexion 2# 2x10  Standing shoulder ABD 2# 2x10  Backwards shoulder rolls x20  Thoracic flexion/extension ROM x12    09/18/2023: Therex: Pulleys AAROM 2 min flexion, 2 min abduction UBE AAROM L3 X 3 min forward, 3 min retro Theractivity: for functional reaching ROM UE ranger X 10 flexion, X 15 circles CW,CCW Lifting and reaching to top shelf in top cabinet, 2# 2X10 Shoulder abduction to 90 deg, 1# 2X10 Rows green 2X10 Shoulder extensions green 2X10 Shoulder ER greeen 2X10 Chest press standing, green 2X10 HEP progressed and review.   PATIENT EDUCATION: Education details: HEP, PT plan of care Person educated: Patient Education method: Explanation, Demonstration, Verbal cues, and Handouts Education comprehension: verbalized understanding and needs further  education   HOME EXERCISE PROGRAM: Access Code: [email or web address] URL: [email or web address] Date: 10/06/2023 Prepared by: Pauletta Browns  Exercises - Standing Shoulder Row with Anchored Resistance  - 1 x daily - 7 x weekly - 1 sets - 20 reps - 3 second hold - Shoulder External Rotation with Anchored Resistance  - 1 x daily - 7 x weekly - 2 sets - 10 reps - 3 seconds hold - Standing Shoulder Posterior Capsule Stretch  - 4-5 x daily - 7 x weekly - 1 sets - 5 reps - 10 econds hold - Shoulder Internal Rotation with Resistance  - 1 x daily - 7 x weekly - 1 sets - 10 reps - 3 seconds hold - Supine Scapular Protraction in Flexion with Dumbbells  - 1 x daily - 7 x weekly - 1 sets - 20-30 reps - 3 seconds hold  ASSESSMENT:  CLINICAL IMPRESSION: Viren has very good active range of motion and strength for beginning week 11 post rotator cuff repair.  We spent some time simplifying his current home program with emphasis on scapular and rotator cuff strengthening.  We also spent most of the visit talking about long-term management, return to work and things to keep in mind to avoid future shoulder problems. He was very receptive to this and had good questions that were answered today.  He remains on track to meet long-term goals.   OBJECTIVE IMPAIRMENTS: decreased activity tolerance, decreased shoulder mobility, decreased ROM, decreased strength, impaired flexibility, impaired UE use,  and pain.  ACTIVITY LIMITATIONS: reaching, lifting, carry,  cleaning, driving, and or occupation  PERSONAL FACTORS:  also affecting patient's functional outcome.  REHAB POTENTIAL: Good  CLINICAL  DECISION MAKING: Stable/uncomplicated  EVALUATION COMPLEXITY: Low    GOALS: Short term PT Goals Target date: 09/06/2023   Pt will be I and compliant with HEP. Baseline:  Goal status: MET 09/06/23  Pt will improve Lt shoulder PROM to [facility] Baseline: Goal status: MET  09/06/23  Long term PT goals Target  date:11/01/2023   Pt will improve Lt shoulder AROM to [facility] to improve functional reaching Baseline: Goal status: On Going 10/06/2023  Pt will improve  Rt shoulder strength to at least 5/5 MMT to improve functional strength Baseline: Goal status: On Going 10/06/2023  Pt will improve FOTO to at least 69% functional to show improved function Baseline: Goal status: ongoing 09/06/23  Pt will reduce pain to overall less than 3/10 with usual activity, work activity, and gym activity Baseline: Goal status: ongoing 10/06/23  PLAN: PT FREQUENCY: 1-3 times per week   PT DURATION: 12 weeks  PLANNED INTERVENTIONS (unless contraindicated): aquatic PT, Canalith repositioning, cryotherapy, Electrical stimulation, Iontophoresis with 4 mg/ml dexamethasome, Moist heat, traction, Ultrasound, gait training, Therapeutic exercise, balance training, neuromuscular re-education, patient/family education, prosthetic training, manual techniques, passive ROM, dry needling, taping, vasopnuematic device, vestibular, spinal manipulations, joint manipulations 97110-Therapeutic exercises, 97530- Therapeutic activity, 97112- Neuromuscular re-education, 97535- Self Care, and 16109- Manual therapy  PLAN FOR NEXT SESSION:  Progress strength as appropriate.                                                  Cherlyn Cushing PT, MPT 10/06/23 5:41 PM

## 2023-10-11 ENCOUNTER — Encounter: Payer: Self-pay | Admitting: Physician Assistant

## 2023-10-11 ENCOUNTER — Ambulatory Visit (INDEPENDENT_AMBULATORY_CARE_PROVIDER_SITE_OTHER): Admitting: Physician Assistant

## 2023-10-11 DIAGNOSIS — Z9889 Other specified postprocedural states: Secondary | ICD-10-CM

## 2023-10-11 NOTE — Progress Notes (Signed)
 Post-Op Visit Note   Patient: Jon Collins           Date of Birth: 07/19/1970           MRN: [id number] Visit Date: 10/11/2023 PCP: [name], MD   Assessment & Plan:  Chief Complaint:  Chief Complaint  Patient presents with   Left Shoulder - Follow-up    Left shoulder scope 07/27/2023   Visit Diagnoses:  1. S/P rotator cuff repair     Plan: Patient is a pleasant 53 year old gentleman who comes in today 3 months status post left shoulder arthroscopic debridement and decompression rotator cuff repair 07/27/2023.  He has been doing great.  He notes just very occasional discomfort with certain exercises.  He has been in physical therapy as well as working on a home exercise program.  Examination of his left shoulder reveals full active range of motion all planes.  Full strength throughout.  He is neurovascularly intact distally.  At this point, we will release him back to work full duty.  He will be very careful with pushing, pulling and any lifting.  I would still like to limit his lifting for another 3 months.  He will follow-up with [name] as needed.  Call with concerns or questions in the meantime.  Follow-Up Instructions: Return if symptoms worsen or fail to improve.   Orders:  No orders of the defined types were placed in this encounter.  No orders of the defined types were placed in this encounter.   Imaging: No new imaging  PMFS History: Patient Active Problem List   Diagnosis Date Noted   Complete tear of left rotator cuff 07/27/2023   Impingement syndrome of left shoulder 07/27/2023   Arthritis of left acromioclavicular joint 07/27/2023   Reduced libido 11/09/2017   Decreased testosterone level 11/07/2017   Mitral valve prolapse 01/14/2016   Hyperlipidemia 04/14/2015   Diverticulitis of large intestine without perforation or abscess with bleeding 09/30/2014   Palpitations suggestive of PVCs or PACs 10/16/2013   Sleep apnea 10/16/2013   A-fib (HCC)  05/15/2012   HTN (hypertension) 05/15/2012   Past Medical History:  Diagnosis Date   Anxiety    Arthritis    knees, left shoulder   Decreased testosterone level 11/07/2017   Diverticulitis of large intestine without perforation or abscess with bleeding 09/30/2014   HTN (hypertension)    Hyperlipidemia 04/14/2015   Lone atrial fibrillation (HCC)    a. Dx 04/2012 in ER. Event monitor 07/2012 for palpitations - pt triggers correlated with NSR only.   MVP (mitral valve prolapse)    a. Pt reported dx at age 29, NOT seen on echo. 2D echo 05/2012: EF 55-60%, no RWMA, grade 2 diastolic dysfunction, no significant valvular abnormalities.   PONV (postoperative nausea and vomiting)    Reduced libido 11/09/2017   Sleep apnea    uses CPAP nightly    Family History  Problem Relation Age of Onset   Hyperlipidemia Father    Stroke Maternal Grandmother    Heart attack Neg Hx     Past Surgical History:  Procedure Laterality Date   COLONOSCOPY     SHOULDER ARTHROSCOPY WITH DISTAL CLAVICLE RESECTION Left 07/27/2023   Procedure: DISTAL CLAVICLE EXCISION, EXTENSIVE DEBRIDEMENT;  Surgeon: Tarry Kos, MD;  Location: [facility];  Service: Orthopedics;  Laterality: Left;   SHOULDER ARTHROSCOPY WITH ROTATOR CUFF REPAIR AND SUBACROMIAL DECOMPRESSION Left 07/27/2023   Procedure: LEFT SHOULDER ARTHROSCOPY WITH ROTATOR CUFF REPAIR, SUBACROMIAL DECOMPRESSION;  Surgeon: Tarry Kos, MD;  Location: [facility];  Service: Orthopedics;  Laterality: Left;   UMBILICAL HERNIA REPAIR     Social History   Occupational History   Not on file  Tobacco Use   Smoking status: Never   Smokeless tobacco: Never  Vaping Use   Vaping status: Never Used  Substance and Sexual Activity   Alcohol use: Yes    Comment: social   Drug use: No   Sexual activity: Not on file

## 2023-10-12 ENCOUNTER — Ambulatory Visit: Admitting: Physician Assistant

## 2023-10-17 ENCOUNTER — Ambulatory Visit: Admitting: Physician Assistant

## 2023-10-17 ENCOUNTER — Ambulatory Visit: Admitting: Orthopaedic Surgery

## 2023-10-20 ENCOUNTER — Encounter: Admitting: Physical Therapy

## 2023-10-25 DIAGNOSIS — Z13220 Encounter for screening for lipoid disorders: Secondary | ICD-10-CM | POA: Diagnosis not present

## 2023-10-25 DIAGNOSIS — Z131 Encounter for screening for diabetes mellitus: Secondary | ICD-10-CM | POA: Diagnosis not present

## 2023-10-25 DIAGNOSIS — I10 Essential (primary) hypertension: Secondary | ICD-10-CM | POA: Diagnosis not present

## 2023-10-25 DIAGNOSIS — Z0000 Encounter for general adult medical examination without abnormal findings: Secondary | ICD-10-CM | POA: Diagnosis not present

## 2023-10-26 ENCOUNTER — Encounter: Admitting: Rehabilitative and Restorative Service Providers"

## 2023-11-02 ENCOUNTER — Ambulatory Visit (INDEPENDENT_AMBULATORY_CARE_PROVIDER_SITE_OTHER): Admitting: Rehabilitative and Restorative Service Providers"

## 2023-11-02 ENCOUNTER — Encounter: Payer: Self-pay | Admitting: Rehabilitative and Restorative Service Providers"

## 2023-11-02 DIAGNOSIS — R600 Localized edema: Secondary | ICD-10-CM

## 2023-11-02 DIAGNOSIS — M6281 Muscle weakness (generalized): Secondary | ICD-10-CM | POA: Diagnosis not present

## 2023-11-02 DIAGNOSIS — M25512 Pain in left shoulder: Secondary | ICD-10-CM | POA: Diagnosis not present

## 2023-11-02 NOTE — Therapy (Addendum)
 OUTPATIENT PHYSICAL THERAPY TREATMENT/PROGRESS NOTE  Progress Note Reporting Period 08/09/2023 to 11/02/2023  See note below for Objective Data and Assessment of Progress/Goals.   PHYSICAL THERAPY DISCHARGE SUMMARY  Visits from Start of Care: 8  Current functional level related to goals / functional outcomes: See note   Remaining deficits: See note   Education / Equipment: HEP   Patient agrees to discharge. Patient goals were mostly met. Patient is being discharged due to meeting the stated rehab goals.    Patient Name: Jon Collins MRN: [id number] DOB:1970-08-19, 53 y.o., male Today's Date: 06/05/2024  END OF SESSION:     Past Medical History:  Diagnosis Date   Anxiety    Arthritis    knees, left shoulder   Decreased testosterone  level 11/07/2017   Diverticulitis of large intestine without perforation or abscess with bleeding 09/30/2014   HTN (hypertension)    Hyperlipidemia 04/14/2015   Lone atrial fibrillation (HCC)    a. Dx 04/2012 in ER. Event monitor 07/2012 for palpitations - pt triggers correlated with NSR only.   MVP (mitral valve prolapse)    a. Pt reported dx at age 5, NOT seen on echo. 2D echo 05/2012: EF 55-60%, no RWMA, grade 2 diastolic dysfunction, no significant valvular abnormalities.   PONV (postoperative nausea and vomiting)    Reduced libido 11/09/2017   Sleep apnea    uses CPAP nightly   Past Surgical History:  Procedure Laterality Date   COLONOSCOPY     SHOULDER ARTHROSCOPY WITH DISTAL CLAVICLE RESECTION Left 07/27/2023   Procedure: DISTAL CLAVICLE EXCISION, EXTENSIVE DEBRIDEMENT;  Surgeon: Jerri Kay HERO, MD;  Location: [facility];  Service: Orthopedics;  Laterality: Left;   SHOULDER ARTHROSCOPY WITH ROTATOR CUFF REPAIR AND SUBACROMIAL DECOMPRESSION Left 07/27/2023   Procedure: LEFT SHOULDER ARTHROSCOPY WITH ROTATOR CUFF REPAIR, SUBACROMIAL DECOMPRESSION;  Surgeon: Jerri Kay HERO, MD;  Location: [facility];   Service: Orthopedics;  Laterality: Left;   UMBILICAL HERNIA REPAIR     Patient Active Problem List   Diagnosis Date Noted   Osteoarthritis of knees, bilateral 01/03/2024   Complete tear of left rotator cuff 07/27/2023   Impingement syndrome of left shoulder 07/27/2023   Arthritis of left acromioclavicular joint 07/27/2023   Reduced libido 11/09/2017   Decreased testosterone  level 11/07/2017   Mitral valve prolapse 01/14/2016   Hyperlipidemia 04/14/2015   Diverticulitis of large intestine without perforation or abscess with bleeding 09/30/2014   Palpitations suggestive of PVCs or PACs 10/16/2013   Sleep apnea 10/16/2013   A-fib (HCC) 05/15/2012   HTN (hypertension) 05/15/2012    PCP: Lang Marna LABOR, MD   REFERRING PROVIDER: Jerri Kay HERO, MD   REFERRING DIAG: [phone] (ICD-10-CM) - Nontraumatic complete tear of left rotator cuff M75.42 (ICD-10-CM) - Impingement syndrome of left shoulder  THERAPY DIAG:  Acute pain of left shoulder - Plan: PT plan of care cert/re-cert  Localized edema - Plan: PT plan of care cert/re-cert  Muscle weakness (generalized) - Plan: PT plan of care cert/re-cert  Rationale for Evaluation and Treatment: Rehabilitation  ONSET DATE: S/p left rotator cuff repair 07/27/23  SUBJECTIVE:  SUBJECTIVE STATEMENT: Jon Collins is starting week 15 post-surgery (rotator cuff repair) repair tomorrow.  He is getting normal uninterrupted sleep.  He mentions some intermittent right upper extremity numbness that appears to be thoracic outlet that we will address today.  Hand dominance: Right  PERTINENT HISTORY:S/p left rotator cuff repair 07/27/23 medium size protocol. 6 weeks post op will be 09/07/23    PAIN:  NPRS scale: 0-3/10 this week Pain location: Left shoulder Pain description:  Soreness but can get a quick jolt with rapid movements or numbness in certain positions Aggravating factors: See above Relieving factors: Rest, exercise   PRECAUTIONS: Shoulder RTC medium size protocol  RED FLAGS: None   WEIGHT BEARING RESTRICTIONS: No  FALLS:  Has patient fallen in last 6 months? 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  PATIENT SURVEYS: 08/09/2023  Eval: FOTO 45% functional, goal is 69%  COGNITION: 08/09/2023 Overall cognitive status: Within functional limits for tasks assessed     SENSATION: 08/09/2023 [facility]  UPPER EXTREMITY ROM:   Active/PROM ROM Left 08/09/2023 Left Supine PROM  Left 09/05/22 PROM Left 09/29/23 AROM  Left/Right 10/06/2023 Left 11/02/2023  Shoulder flexion /90 120 140 158* 155/180 165  Shoulder extension        Shoulder abduction /90  150 139*    Shoulder horizontal adduction     15/35 30  Shoulder internal rotation /70 at 70 deg abd   L5  55/50 65  Shoulder external rotation /30 at 70 deg abd  60 at 70 deg abd C4  90/110 80  Elbow flexion        Elbow extension        Wrist flexion        Wrist extension        Wrist ulnar deviation        Wrist radial deviation        Wrist pronation        Wrist supination        (Blank rows = not tested)  UPPER EXTREMITY MMT:  MMT Left 08/09/2023 Was not tested due to post op precautions Left/Right 10/06/2023 Left 11/02/2023  Shoulder flexion     Shoulder extension     Shoulder abduction     Shoulder adduction     Shoulder internal rotation  IR 28.1/47.6 IR 33.9  Shoulder external rotation  ER 18.9/42.5 ER 22.4  Middle trapezius     Lower trapezius     Elbow flexion     Elbow extension     Wrist flexion     Wrist extension     Wrist ulnar deviation     Wrist  radial deviation     Wrist pronation     Wrist supination     Grip strength (lbs)     (Blank rows = not tested)                  TODAY'S TREATMENT:  DATE:  11/02/2023 Shoulder blade pinches 10 x 5 seconds Thera-Band Green ER 2 sets of 10, slow eccentrics Thera-Band Green IR 10 x slow eccentrics Prone 90 and 100 degrees Chest stretch in door frame 4 x 20 seconds  Functional Activities: Reaching across body: Posterior capsule stretch 5 x 10 seconds Reaching: Scapular protraction/supine arm raises 3# 20 x 3 seconds Pulling: Pull to chest 20 x 3 seconds, short range, emphasize shoulder pinch, slow eccentrics Blue Thera-Band  [id number]: Reviewed reassessment findings, discussed that normal healing post rotator cuff repair is 9 to 12 months and discussed progressions for Paxtyn with his independent rehabilitation over the next 9 to 12 months with expectations of 90% or better strength and 95% or better active range of motion at 1 year post-surgery   10/06/2023 Scapular protraction/supine arm raises 3# 20 x 3 seconds Posterior capsule stretch 10 x 10 seconds Pull to chest 20 x 3 seconds, short range, emphasize shoulder pinch, slow eccentrics Blue Thera-Band Thera-Band Green ER 2 sets of 10, slow eccentrics Thera-Band Green IR 10 x slow eccntrics  Functional Activities: Reviewed objective measures obtained today for active range of motion and strength.  Discussed healing times post rotator cuff repair (9 to 12 months) and recommended he discuss return to work with Dr. Jerri as he will be the one releasing him for return to work.  Also discussed with Greig the importance of avoiding lifting with the elbow extended away from the body and to try to keep objects close when returning to work.  Reviewed shoulder anatomy with the model and discussed his surgery and long-term recommendations for avoiding future  problems.   09/29/23 UBE L3.5 x4 min forward/4 min backwards ROM check- see above Rows green TB x15  Shoulder extensions green TB x15  Scaption on wall ladder 15x3 second holds  Standing Ys 2# x10  Standing Ts 2# x10  Standing Is 2# x10  UE reacher CW/CCW circles x20 each way  UE reacher alphabet x2 rounds with break in between  Supine shoulder serratus punch 2# x15 Supine shoulder serratus punch circles 2x10 2#  Supine shoulder serratus alphabet 2# x2 Standing shoulder flexion 2# 2x10  Standing shoulder ABD 2# 2x10  Backwards shoulder rolls x20  Thoracic flexion/extension ROM x12    PATIENT EDUCATION: Education details: HEP, PT plan of care Person educated: Patient Education method: Explanation, Demonstration, Verbal cues, and Handouts Education comprehension: verbalized understanding and needs further education   HOME EXERCISE PROGRAM: Access Code: [email or web address] URL: [email or web address] Date: 11/02/2023 Prepared by: Lamar Ivory  Exercises - Standing Shoulder Row with Anchored Resistance  - 1 x daily - 3 x weekly - 1 sets - 20 reps - 3 second hold - Shoulder External Rotation with Anchored Resistance  - 1 x daily - 3 x weekly - 2 sets - 10 reps - 3 seconds hold - Standing Shoulder Posterior Capsule Stretch  - 1 x daily - 7 x weekly - 1 sets - 5 reps - 10 econds hold - Shoulder Internal Rotation with Resistance  - 1 x daily - 3 x weekly - 1 sets - 10 reps - 3 seconds hold - Supine Scapular Protraction in Flexion with Dumbbells  - 1 x daily - 3 x weekly - 1 sets - 30 reps - 3 seconds hold - Standing Scapular Retraction  - 2-3 x daily - 7 x weekly - 1 sets - 5 reps - 5 second hold - Prone Shoulder Horizontal Abduction with Thumbs Up  - 1  x daily - 3 x weekly - 1-2 sets - 10 reps - 3 seconds hold  ASSESSMENT:  CLINICAL IMPRESSION: AROM is 93% and strength 62% of the uninvolved right shoulder (90% and 60% expected after 12 weeks, we are at week 14.  We  addressed some thoracic outlet type symptoms that Teven was having and I do not think this will be a problem long-term.  Margrette has demonstrated independence with his home exercise program which is addressing remaining active range of motion, strength and functional impairments.  Camellia has returned to work with some soreness but no particular functional difficulty.  Nishan has the option to return for more supervised rehabilitation in the next 30 days if requested, although I do not think that he will require this.  Parents also knows he can contact me with any questions regarding his long-term rehabilitation, which we discussed today and updated his long-term home exercise program.  OBJECTIVE IMPAIRMENTS: decreased activity tolerance, decreased shoulder mobility, decreased ROM, decreased strength, impaired flexibility, impaired UE use,  and pain.  ACTIVITY LIMITATIONS: reaching, lifting, carry,  cleaning, driving, and or occupation  PERSONAL FACTORS:  also affecting patient's functional outcome.  REHAB POTENTIAL: Good  CLINICAL DECISION MAKING: Stable/uncomplicated  EVALUATION COMPLEXITY: Low    GOALS: Short term PT Goals Target date: 09/06/2023   Pt will be I and compliant with HEP. Baseline:  Goal status: MET 09/06/23  Pt will improve Lt shoulder PROM to [facility] Baseline: Goal status: MET  09/06/23  Long term PT goals Target date: 12/02/2023   Pt will improve Lt shoulder AROM to [facility] to improve functional reaching Baseline: Goal status: Met 11/02/2023  Pt will improve  Rt shoulder strength to at least 5/5 MMT to improve functional strength Baseline: Goal status: On Going 11/02/2023  Pt will improve FOTO to at least 69% functional to show improved function Baseline: Goal status: On Going 11/02/23  Pt will reduce pain to overall less than 3/10 with usual activity, work activity, and gym activity Baseline: Goal status: Met 11/02/2023  PLAN: PT FREQUENCY: If needed   PT  DURATION: 30 days  PLANNED INTERVENTIONS (unless contraindicated): aquatic PT, Canalith repositioning, cryotherapy, Electrical stimulation, Iontophoresis with 4 mg/ml dexamethasome, Moist heat, traction, Ultrasound, gait training, Therapeutic exercise, balance training, neuromuscular re-education, patient/family education, prosthetic training, manual techniques, passive ROM, dry needling, taping, vasopnuematic device, vestibular, spinal manipulations, joint manipulations 97110-Therapeutic exercises, 97530- Therapeutic activity, 97112- Neuromuscular re-education, 97535- Self Care, and 02859- Manual therapy  PLAN FOR NEXT SESSION: Take objective measures and modify home exercise program if needed.                                                  Myer LELON Ivory PT, MPT 06/05/24 1:45 PM

## 2023-11-07 ENCOUNTER — Telehealth: Payer: Self-pay

## 2023-11-07 ENCOUNTER — Ambulatory Visit (INDEPENDENT_AMBULATORY_CARE_PROVIDER_SITE_OTHER): Admitting: Physician Assistant

## 2023-11-07 DIAGNOSIS — M1711 Unilateral primary osteoarthritis, right knee: Secondary | ICD-10-CM | POA: Diagnosis not present

## 2023-11-07 MED ORDER — LIDOCAINE HCL 1 % IJ SOLN
2.0000 mL | INTRAMUSCULAR | Status: AC | PRN
  Administered 2023-11-07: 2 mL

## 2023-11-07 MED ORDER — BUPIVACAINE HCL 0.25 % IJ SOLN
2.0000 mL | INTRAMUSCULAR | Status: AC | PRN
  Administered 2023-11-07: 2 mL via INTRA_ARTICULAR

## 2023-11-07 MED ORDER — METHYLPREDNISOLONE ACETATE 40 MG/ML IJ SUSP
40.0000 mg | INTRAMUSCULAR | Status: AC | PRN
Start: 2023-11-07 — End: 2023-11-07
  Administered 2023-11-07: 40 mg via INTRA_ARTICULAR

## 2023-11-07 NOTE — Progress Notes (Signed)
 Office Visit Note   Patient: Jon Collins           Date of Birth: [birth year]           MRN: [id number] Visit Date: 11/07/2023              Requested by: Alex Hylan, MD [address] [place],  [place] [address] PCP: Alex Hylan, MD   Assessment & Plan: Visit Diagnoses:  1. Unilateral primary osteoarthritis, right knee     Plan: Impression is right knee osteoarthritis.  Today, we proceed with right knee cortisone injection.  He would also like to go and get approval for viscosupplementation injection.  Follow-up once approved.  Call with concerns or questions.  Follow-Up Instructions: Return for f/u for visco inj.   Orders:  Orders Placed This Encounter  Procedures   Large Joint Inj   No orders of the defined types were placed in this encounter.     Procedures: Large Joint Inj: R knee on 11/07/2023 3:13 PM Medications: 2 mL lidocaine  1 %; 2 mL bupivacaine  0.25 %; 40 mg methylPREDNISolone acetate 40 MG/ML      Clinical Data: No additional findings.   Subjective: Chief Complaint  Patient presents with   Left Shoulder - Follow-up    HPI patient is a very pleasant 53 year old gentleman who comes in today with recurrent right knee pain.  History of underlying osteoarthritis.  He was seen by us  in October 2024 where he underwent viscosupplementation injection.  He had great relief until about a week ago.  No new injury.  The pain he has is throughout the knee specifically in the popliteal fossa.  Symptoms are worse when he first gets out of the bed in the morning as well as when he is getting out of his truck.  He does note some instability with pain.  He also has some occasional swelling.  Review of Systems as detailed in HPI.  All others reviewed and are negative.   Objective: Vital Signs: There were no vitals taken for this visit.  Physical Exam well-developed well-nourished gentleman in no acute distress.  Alert and oriented x 3.  Ortho  Exam right knee exam: Small effusion.  Range of motion 0 to 20 degrees.  Moderate patellofemoral crepitus.  No joint line tenderness.  He is neurovascularly intact distally.  Specialty Comments:  CLINICAL DATA:  pain   EXAM: MRI CERVICAL SPINE WITHOUT CONTRAST   TECHNIQUE: Multiplanar, multisequence MR imaging of the cervical spine was performed. No intravenous contrast was administered.   COMPARISON:  None Available.   FINDINGS: Alignment: Straightening of the normal cervical lordosis   Vertebrae: No fracture, evidence of discitis, or bone lesion.   Cord: Normal signal and morphology.   Posterior Fossa, vertebral arteries, paraspinal tissues: Negative.   Disc levels:   C1-C2: Mild degenerative change.   C2-C3: Mild bilateral facet degenerative change. No significant disc bulge. No spinal canal narrowing. Mild left neural foraminal narrowing.   C3-C4: Mild bilateral facet degenerative change. Uncovertebral hypertrophy. Minimal disc bulge. No significant spinal canal narrowing. Moderate left neural foraminal narrowing.   C4-C5: Mild bilateral facet degenerative change. Circumferential disc bulge. Uncovertebral hypertrophy. Mild spinal canal narrowing. Moderate bilateral neural foraminal narrowing, left-greater-than-right.   C5-C6: Mild bilateral facet degenerative change. Uncovertebral hypertrophy. Minimal disc bulge. Mild spinal canal narrowing. Severe left and moderate right neural foraminal narrowing.   C6-C7: Minimal disc bulge. Uncovertebral hypertrophy. Moderate left and mild right neural foraminal narrowing. No significant  spinal canal narrowing.   C7-T1: No significant disc bulge. Mild bilateral facet degenerative change. No spinal canal or neural foraminal narrowing.   IMPRESSION: 1. Multilevel degenerative changes of the cervical spine with mild spinal canal narrowing at C4-C5 and C5-C6. 2. Multilevel neural foraminal narrowing, severe on the left  at C5-C6 and moderate on the left at C3-C4, C4-C5, C5-C6, and C6-C7.     Electronically Signed   By: Clora Dane M.D.   On: 06/14/2023 08:41  Imaging: No results found.   PMFS History: Patient Active Problem List   Diagnosis Date Noted   Complete tear of left rotator cuff 07/27/2023   Impingement syndrome of left shoulder 07/27/2023   Arthritis of left acromioclavicular joint 07/27/2023   Reduced libido 11/09/2017   Decreased testosterone  level 11/07/2017   Mitral valve prolapse 01/14/2016   Hyperlipidemia 04/14/2015   Diverticulitis of large intestine without perforation or abscess with bleeding 09/30/2014   Palpitations suggestive of PVCs or PACs 10/16/2013   Sleep apnea 10/16/2013   A-fib (HCC) 05/15/2012   HTN (hypertension) 05/15/2012   Past Medical History:  Diagnosis Date   Anxiety    Arthritis    knees, left shoulder   Decreased testosterone  level 11/07/2017   Diverticulitis of large intestine without perforation or abscess with bleeding 09/30/2014   HTN (hypertension)    Hyperlipidemia 04/14/2015   Lone atrial fibrillation (HCC)    a. Dx 04/2012 in ER. Event monitor 07/2012 for palpitations - pt triggers correlated with NSR only.   MVP (mitral valve prolapse)    a. Pt reported dx at age 37, NOT seen on echo. 2D echo 05/2012: EF 55-60%, no RWMA, grade 2 diastolic dysfunction, no significant valvular abnormalities.   PONV (postoperative nausea and vomiting)    Reduced libido 11/09/2017   Sleep apnea    uses CPAP nightly    Family History  Problem Relation Age of Onset   Hyperlipidemia Father    Stroke Maternal Grandmother    Heart attack Neg Hx     Past Surgical History:  Procedure Laterality Date   COLONOSCOPY     SHOULDER ARTHROSCOPY WITH DISTAL CLAVICLE RESECTION Left 07/27/2023   Procedure: DISTAL CLAVICLE EXCISION, EXTENSIVE DEBRIDEMENT;  Surgeon: Wes Hamman, MD;  Location: [facility];  Service: Orthopedics;  Laterality: Left;    SHOULDER ARTHROSCOPY WITH ROTATOR CUFF REPAIR AND SUBACROMIAL DECOMPRESSION Left 07/27/2023   Procedure: LEFT SHOULDER ARTHROSCOPY WITH ROTATOR CUFF REPAIR, SUBACROMIAL DECOMPRESSION;  Surgeon: Wes Hamman, MD;  Location: [facility];  Service: Orthopedics;  Laterality: Left;   UMBILICAL HERNIA REPAIR     Social History   Occupational History   Not on file  Tobacco Use   Smoking status: Never   Smokeless tobacco: Never  Vaping Use   Vaping status: Never Used  Substance and Sexual Activity   Alcohol use: Yes    Comment: social   Drug use: No   Sexual activity: Not on file

## 2023-11-07 NOTE — Telephone Encounter (Signed)
 Please precert for right knee gel injection. Thanks

## 2023-11-09 ENCOUNTER — Encounter: Admitting: Rehabilitative and Restorative Service Providers"

## 2023-11-22 NOTE — Telephone Encounter (Signed)
 VOB submitted for Durolane, right knee.

## 2023-11-23 ENCOUNTER — Telehealth: Payer: Self-pay

## 2023-11-23 DIAGNOSIS — M1711 Unilateral primary osteoarthritis, right knee: Secondary | ICD-10-CM

## 2023-11-23 NOTE — Telephone Encounter (Signed)
 Talked with patient concerning gel injection for right knee and advised him that is approved.  Patient stated that he is doing good at the moment and would like to hold off on gel injection.  Will call back when ready.  See referrals tab

## 2023-12-27 ENCOUNTER — Other Ambulatory Visit: Payer: Self-pay

## 2023-12-27 DIAGNOSIS — M170 Bilateral primary osteoarthritis of knee: Secondary | ICD-10-CM

## 2024-01-01 ENCOUNTER — Encounter: Payer: Self-pay | Admitting: Physician Assistant

## 2024-01-02 ENCOUNTER — Ambulatory Visit (INDEPENDENT_AMBULATORY_CARE_PROVIDER_SITE_OTHER): Admitting: Orthopaedic Surgery

## 2024-01-02 DIAGNOSIS — M170 Bilateral primary osteoarthritis of knee: Secondary | ICD-10-CM

## 2024-01-02 MED ORDER — SODIUM HYALURONATE 60 MG/3ML IX PRSY
60.0000 mg | PREFILLED_SYRINGE | INTRA_ARTICULAR | Status: AC | PRN
  Administered 2024-01-02: 60 mg via INTRA_ARTICULAR

## 2024-01-02 NOTE — Progress Notes (Signed)
   Procedure Note  Patient: Jon Collins             Date of Birth: 1971-03-14           MRN: [id number]             Visit Date: 01/02/2024  Procedures: Visit Diagnoses:  1. Bilateral primary osteoarthritis of knee     Large Joint Inj: bilateral knee on 01/02/2024 2:24 PM Indications: pain Details: 22 G needle  Arthrogram: No  Medications (Right): 60 mg Sodium Hyaluronate 60 MG/3ML Medications (Left): 60 mg Sodium Hyaluronate 60 MG/3ML Outcome: tolerated well, no immediate complications Patient was prepped and draped in the usual sterile fashion.

## 2024-01-03 ENCOUNTER — Ambulatory Visit (INDEPENDENT_AMBULATORY_CARE_PROVIDER_SITE_OTHER): Admitting: Physician Assistant

## 2024-01-03 ENCOUNTER — Telehealth: Payer: Self-pay | Admitting: Surgical

## 2024-01-03 DIAGNOSIS — M170 Bilateral primary osteoarthritis of knee: Secondary | ICD-10-CM | POA: Insufficient documentation

## 2024-01-03 NOTE — Telephone Encounter (Signed)
 Patient saw Ronal dragon this morning

## 2024-01-03 NOTE — Progress Notes (Signed)
 Office Visit Note   Patient: Jon Collins           Date of Birth: March 08, 1971           MRN: [id number] Visit Date: 01/03/2024              Requested by: Jon Marna LABOR, MD [address] [place],  [place] [address] PCP: Jon Marna LABOR, MD  No chief complaint on file.     HPI: Jon Collins is a pleasant 53 year old gentleman who is a patient of Dr. Jerri.  He has osteoarthritis of both of his knees.  He periodically gets viscosupplementation.  This has helped him quite a bit.  He was injected with Dura line yesterday.  He called on-call last night saying he was having significant increase in pain and what he felt was inflammation in his knees.  Was told to follow-up today.   Assessment & Plan: Visit Diagnoses:  1. Primary osteoarthritis of both knees     Plan: He does not have any fever chills he actually says the pain is gotten a little bit better since last night.  He is taking over-the-counter medications he does not like to take anti-inflammatories.  I do not see any sign of infection or reactive synovitis in his knees today he has no effusion does have some swelling but has good range of motion.  Would treat this conservatively with ice elevation and rest.  If he has any progression of symptoms should contact us  immediately.  Note was provided for work today.  Follow-Up Instructions: Return if symptoms worsen or fail to improve.   Ortho Exam  Patient is alert, oriented, no adenopathy, well-dressed, normal affect, normal respiratory effort. Bilateral knees no effusion no erythema compartments are soft and compressible he is neurovascularly intact he has good flexion and extension.  Cannot appreciate any findings consistent with reactive synovitis or infection mild soft tissue swelling    Imaging: No results found. No images are attached to the encounter.  Labs: No results found for: HGBA1C, ESRSEDRATE, CRP, LABURIC, REPTSTATUS, GRAMSTAIN, CULT,  LABORGA   Lab Results  Component Value Date   ALBUMIN 4.2 08/14/2023   ALBUMIN 4.3 03/10/2020   ALBUMIN 4.1 11/24/2018    Lab Results  Component Value Date   MG 2.0 06/16/2014   MG 2.0 10/16/2013   No results found for: VD25OH  No results found for: PREALBUMIN    05/31/2021    2:00 PM 03/10/2020    7:30 AM 11/24/2018    2:32 PM  CBC EXTENDED  WBC WILL FOLLOW  P 3.9  3.7   RBC WILL FOLLOW  P 4.77  4.99   Hemoglobin WILL FOLLOW  P 14.4  14.6   HCT WILL FOLLOW  P 41.8  42.8   Platelets WILL FOLLOW  P 225  217   NEUT# WILL FOLLOW  P  1.4   Lymph# WILL FOLLOW  P  1.7     P Preliminary result     There is no height or weight on file to calculate BMI.  Orders:  No orders of the defined types were placed in this encounter.  No orders of the defined types were placed in this encounter.    Procedures: No procedures performed  Clinical Data: No additional findings.  ROS:  All other systems negative, except as noted in the HPI. Review of Systems  Objective: Vital Signs: There were no vitals taken for this visit.  Specialty Comments:  CLINICAL DATA:  pain   EXAM: MRI CERVICAL SPINE WITHOUT CONTRAST   TECHNIQUE: Multiplanar, multisequence MR imaging of the cervical spine was performed. No intravenous contrast was administered.   COMPARISON:  None Available.   FINDINGS: Alignment: Straightening of the normal cervical lordosis   Vertebrae: No fracture, evidence of discitis, or bone lesion.   Cord: Normal signal and morphology.   Posterior Fossa, vertebral arteries, paraspinal tissues: Negative.   Disc levels:   C1-C2: Mild degenerative change.   C2-C3: Mild bilateral facet degenerative change. No significant disc bulge. No spinal canal narrowing. Mild left neural foraminal narrowing.   C3-C4: Mild bilateral facet degenerative change. Uncovertebral hypertrophy. Minimal disc bulge. No significant spinal canal narrowing. Moderate left neural  foraminal narrowing.   C4-C5: Mild bilateral facet degenerative change. Circumferential disc bulge. Uncovertebral hypertrophy. Mild spinal canal narrowing. Moderate bilateral neural foraminal narrowing, left-greater-than-right.   C5-C6: Mild bilateral facet degenerative change. Uncovertebral hypertrophy. Minimal disc bulge. Mild spinal canal narrowing. Severe left and moderate right neural foraminal narrowing.   C6-C7: Minimal disc bulge. Uncovertebral hypertrophy. Moderate left and mild right neural foraminal narrowing. No significant spinal canal narrowing.   C7-T1: No significant disc bulge. Mild bilateral facet degenerative change. No spinal canal or neural foraminal narrowing.   IMPRESSION: 1. Multilevel degenerative changes of the cervical spine with mild spinal canal narrowing at C4-C5 and C5-C6. 2. Multilevel neural foraminal narrowing, severe on the left at C5-C6 and moderate on the left at C3-C4, C4-C5, C5-C6, and C6-C7.     Electronically Signed   By: Jon Collins M.D.   On: 06/14/2023 08:41  PMFS History: Patient Active Problem List   Diagnosis Date Noted   Osteoarthritis of knees, bilateral 01/03/2024   Complete tear of left rotator cuff 07/27/2023   Impingement syndrome of left shoulder 07/27/2023   Arthritis of left acromioclavicular joint 07/27/2023   Reduced libido 11/09/2017   Decreased testosterone  level 11/07/2017   Mitral valve prolapse 01/14/2016   Hyperlipidemia 04/14/2015   Diverticulitis of large intestine without perforation or abscess with bleeding 09/30/2014   Palpitations suggestive of PVCs or PACs 10/16/2013   Sleep apnea 10/16/2013   A-fib (HCC) 05/15/2012   HTN (hypertension) 05/15/2012   Past Medical History:  Diagnosis Date   Anxiety    Arthritis    knees, left shoulder   Decreased testosterone  level 11/07/2017   Diverticulitis of large intestine without perforation or abscess with bleeding 09/30/2014   HTN (hypertension)     Hyperlipidemia 04/14/2015   Lone atrial fibrillation (HCC)    a. Dx 04/2012 in ER. Event monitor 07/2012 for palpitations - pt triggers correlated with NSR only.   MVP (mitral valve prolapse)    a. Pt reported dx at age 68, NOT seen on echo. 2D echo 05/2012: EF 55-60%, no RWMA, grade 2 diastolic dysfunction, no significant valvular abnormalities.   PONV (postoperative nausea and vomiting)    Reduced libido 11/09/2017   Sleep apnea    uses CPAP nightly    Family History  Problem Relation Age of Onset   Hyperlipidemia Father    Stroke Maternal Grandmother    Heart attack Neg Hx     Past Surgical History:  Procedure Laterality Date   COLONOSCOPY     SHOULDER ARTHROSCOPY WITH DISTAL CLAVICLE RESECTION Left 07/27/2023   Procedure: DISTAL CLAVICLE EXCISION, EXTENSIVE DEBRIDEMENT;  Surgeon: Jon Collins Kay HERO, MD;  Location: [facility];  Service: Orthopedics;  Laterality: Left;   SHOULDER ARTHROSCOPY WITH ROTATOR CUFF REPAIR AND SUBACROMIAL DECOMPRESSION  Left 07/27/2023   Procedure: LEFT SHOULDER ARTHROSCOPY WITH ROTATOR CUFF REPAIR, SUBACROMIAL DECOMPRESSION;  Surgeon: Jon Collins Kay HERO, MD;  Location: [facility];  Service: Orthopedics;  Laterality: Left;   UMBILICAL HERNIA REPAIR     Social History   Occupational History   Not on file  Tobacco Use   Smoking status: Never   Smokeless tobacco: Never  Vaping Use   Vaping status: Never Used  Substance and Sexual Activity   Alcohol use: Yes    Comment: social   Drug use: No   Sexual activity: Not on file

## 2024-01-03 NOTE — Telephone Encounter (Signed)
 FYI

## 2024-01-03 NOTE — Telephone Encounter (Signed)
 Patient called and said that he is still having issues and Per luke told him to try to get seen today. CB#[phone]

## 2024-01-10 DIAGNOSIS — R051 Acute cough: Secondary | ICD-10-CM | POA: Diagnosis not present

## 2024-01-10 DIAGNOSIS — J189 Pneumonia, unspecified organism: Secondary | ICD-10-CM | POA: Diagnosis not present

## 2024-01-10 DIAGNOSIS — J40 Bronchitis, not specified as acute or chronic: Secondary | ICD-10-CM | POA: Diagnosis not present

## 2024-01-10 DIAGNOSIS — K5904 Chronic idiopathic constipation: Secondary | ICD-10-CM | POA: Diagnosis not present

## 2024-01-19 ENCOUNTER — Telehealth: Payer: Self-pay | Admitting: Nurse Practitioner

## 2024-01-19 NOTE — Telephone Encounter (Signed)
 Shortness of breath for the past few days to one week. Pt reports feeling kind of sluggish. Feels heaviness in his chest, like harder to catch his breath. No chest pain, No jaw, neck, arm, or back pain, no dizziness.  Soonest appt with APP 01/29/24. Asked him to keep a log of BP and heart rate daily. Given ER precautions. He verbalized understanding.

## 2024-01-19 NOTE — Telephone Encounter (Signed)
 Pt c/o Shortness Of Breath: STAT if SOB developed within the last 24 hours or pt is noticeably SOB on the phone  1. Are you currently SOB (can you hear that pt is SOB on the phone)? No  2. How long have you been experiencing SOB? A week  3. Are you SOB when sitting or when up moving around? Sitting down   4. Are you currently experiencing any other symptoms? No

## 2024-01-27 NOTE — Progress Notes (Deleted)
 Office Visit    Patient Name: Jon Collins Date of Encounter: 01/27/2024  PCP:  Lang Marna LABOR, MD   [facility]  Cardiologist:  Aleene Passe, MD  Advanced Practice Provider:  No care team member to display Electrophysiologist:  None    HPI    Jon Collins is a 53 y.o. male with a past medical history significant for hypertension, anxiety, hyperlipidemia, atrial fibrillation, MVP (reported diagnosis at age 15, not seen on echocardiogram) presents today for annual follow-up appointment.  He was last seen 05/31/2021 and at that time he was doing well.  Worked as a Technical sales engineer.  Only on ASA, CHA2DS2-VASc score of 1.  History of PAF and hypertension.  Today he ***  Past Medical History    Past Medical History:  Diagnosis Date   Anxiety    Arthritis    knees, left shoulder   Decreased testosterone  level 11/07/2017   Diverticulitis of large intestine without perforation or abscess with bleeding 09/30/2014   HTN (hypertension)    Hyperlipidemia 04/14/2015   Lone atrial fibrillation (HCC)    a. Dx 04/2012 in ER. Event monitor 07/2012 for palpitations - pt triggers correlated with NSR only.   MVP (mitral valve prolapse)    a. Pt reported dx at age 72, NOT seen on echo. 2D echo 05/2012: EF 55-60%, no RWMA, grade 2 diastolic dysfunction, no significant valvular abnormalities.   PONV (postoperative nausea and vomiting)    Reduced libido 11/09/2017   Sleep apnea    uses CPAP nightly   Past Surgical History:  Procedure Laterality Date   COLONOSCOPY     SHOULDER ARTHROSCOPY WITH DISTAL CLAVICLE RESECTION Left 07/27/2023   Procedure: DISTAL CLAVICLE EXCISION, EXTENSIVE DEBRIDEMENT;  Surgeon: Jerri Kay HERO, MD;  Location: [facility];  Service: Orthopedics;  Laterality: Left;   SHOULDER ARTHROSCOPY WITH ROTATOR CUFF REPAIR AND SUBACROMIAL DECOMPRESSION Left 07/27/2023   Procedure: LEFT SHOULDER ARTHROSCOPY WITH ROTATOR CUFF  REPAIR, SUBACROMIAL DECOMPRESSION;  Surgeon: Jerri Kay HERO, MD;  Location:  SURGERY CENTER;  Service: Orthopedics;  Laterality: Left;   UMBILICAL HERNIA REPAIR      Allergies  Allergies  Allergen Reactions   Amoxicillin Hives    EKGs/Labs/Other Studies Reviewed:   The following studies were reviewed today:  Echocardiogram 07/11/2019  IMPRESSIONS     1. Left ventricular ejection fraction, by visual estimation, is 60 to  65%. The left ventricle has normal function. Left ventricular septal wall  thickness was moderately increased. Mildly increased left ventricular  posterior wall thickness. There is  moderately increased left ventricular hypertrophy.   2. Left ventricular diastolic parameters are indeterminate.   3. The left ventricle has no regional wall motion abnormalities.   4. Global right ventricle has normal systolic function.The right  ventricular size is normal. No increase in right ventricular wall  thickness.   5. Left atrial size was normal.   6. Right atrial size was normal.   7. The mitral valve is normal in structure. No evidence of mitral valve  regurgitation. No evidence of mitral stenosis.   8. The tricuspid valve is normal in structure.   9. The aortic valve is tricuspid. Aortic valve regurgitation is not  visualized. No evidence of aortic valve sclerosis or stenosis.  10. The pulmonic valve was normal in structure. Pulmonic valve  regurgitation is trivial.  11. Normal pulmonary artery systolic pressure.  12. The inferior vena cava is normal in size with greater than 50%  respiratory variability, suggesting right atrial pressure of 3 mmHg.   FINDINGS   Left Ventricle: Left ventricular ejection fraction, by visual estimation,  is 60 to 65%. The left ventricle has normal function. The left ventricle  has no regional wall motion abnormalities. Mildly increased left  ventricular posterior wall thickness.  There is moderately increased left  ventricular hypertrophy. Left  ventricular diastolic parameters are indeterminate. Indeterminate filling  pressures.   Right Ventricle: The right ventricular size is normal. No increase in  right ventricular wall thickness. Global RV systolic function is has  normal systolic function. The tricuspid regurgitant velocity is 2.14 m/s,  and with an assumed right atrial pressure   of 3 mmHg, the estimated right ventricular systolic pressure is normal at  21.3 mmHg.   Left Atrium: Left atrial size was normal in size.   Right Atrium: Right atrial size was normal in size   Pericardium: There is no evidence of pericardial effusion.   Mitral Valve: The mitral valve is normal in structure. No evidence of  mitral valve regurgitation. No evidence of mitral valve stenosis by  observation.   Tricuspid Valve: The tricuspid valve is normal in structure. Tricuspid  valve regurgitation is trivial.   Aortic Valve: The aortic valve is tricuspid. Aortic valve regurgitation is  not visualized. The aortic valve is structurally normal, with no evidence  of sclerosis or stenosis.   Pulmonic Valve: The pulmonic valve was normal in structure. Pulmonic valve  regurgitation is trivial. Pulmonic regurgitation is trivial.   Aorta: The aortic root, ascending aorta and aortic arch are all  structurally normal, with no evidence of dilitation or obstruction.   Venous: The inferior vena cava is normal in size with greater than 50%  respiratory variability, suggesting right atrial pressure of 3 mmHg.   IAS/Shunts: No atrial level shunt detected by color flow Doppler. There is  no evidence of a patent foramen ovale. No ventricular septal defect is  seen or detected. There is no evidence of an atrial septal defect.       EKG:  EKG is *** ordered today.  The ekg ordered today demonstrates ***  Recent Labs: 08/14/2023: ALT 39  Recent Lipid Panel    Component Value Date/Time   CHOL 166 08/14/2023 0934   TRIG  96 08/14/2023 0934   HDL 43 08/14/2023 0934   CHOLHDL 3.9 08/14/2023 0934   CHOLHDL 4.3 12/01/2015 0856   VLDL 22 12/01/2015 0856   LDLCALC 105 (H) 08/14/2023 0934   LDLDIRECT 176.4 02/19/2013 1143    Risk Assessment/Calculations:  {Does this patient have ATRIAL FIBRILLATION?[phone]}  Home Medications   No outpatient medications have been marked as taking for the 01/29/24 encounter (Appointment) with Lucien Orren SAILOR, PA-C.     Review of Systems   ***   All other systems reviewed and are otherwise negative except as noted above.  Physical Exam    VS:  There were no vitals taken for this visit. , BMI There is no height or weight on file to calculate BMI.  Wt Readings from Last 3 Encounters:  08/14/23 245 lb 12.8 oz (111.5 kg)  07/27/23 243 lb 6.2 oz (110.4 kg)  06/30/23 246 lb 9.6 oz (111.9 kg)     GEN: Well nourished, well developed, in no acute distress. HEENT: normal. Neck: Supple, no JVD, carotid bruits, or masses. Cardiac: ***RRR, no murmurs, rubs, or gallops. No clubbing, cyanosis, edema.  ***Radials/PT 2+ and equal bilaterally.  Respiratory:  ***Respirations regular and unlabored, clear  to auscultation bilaterally. GI: Soft, nontender, nondistended. MS: No deformity or atrophy. Skin: Warm and dry, no rash. Neuro:  Strength and sensation are intact. Psych: Normal affect.  Assessment & Plan    Paroxysmal atrial fibrillation Hypertension Hyperlipidemia  No BP recorded.  {Refresh Note OR Click here to enter BP  :1}***      Disposition: Follow up {follow up:15908} with Aleene Passe, MD or APP.  Signed, Orren LOISE Fabry, PA-C 01/27/2024, 12:37 PM [facility]

## 2024-01-29 ENCOUNTER — Ambulatory Visit: Admitting: Physician Assistant

## 2024-01-29 DIAGNOSIS — I480 Paroxysmal atrial fibrillation: Secondary | ICD-10-CM

## 2024-01-29 DIAGNOSIS — R002 Palpitations: Secondary | ICD-10-CM

## 2024-01-29 DIAGNOSIS — I341 Nonrheumatic mitral (valve) prolapse: Secondary | ICD-10-CM

## 2024-01-29 DIAGNOSIS — E782 Mixed hyperlipidemia: Secondary | ICD-10-CM

## 2024-01-29 DIAGNOSIS — I10 Essential (primary) hypertension: Secondary | ICD-10-CM

## 2024-01-29 NOTE — Progress Notes (Deleted)
  Cardiology Office Note   Date:  01/29/2024  ID:  Matai Carpenito, DOB 1970/09/21, MRN [id number] PCP: Lang Marna LABOR, MD  [facility] Providers Cardiologist:  Aleene Passe, MD      History of Present Illness Jon Collins is a 53 y.o. male with a past medical history of hypertension, hyperlipidemia, anxiety, PE AF, MVP, OSA on CPAP.  Patient followed by Dr. Passe, presents today for evaluation of chest heaviness and shortness of breath.   Patient was initially seen in 2013 for evaluation of atrial fibrillation.  He is currently not on anticoagulation due to CHA2DS2-VASc score of 1.  Had an echocardiogram completed on on 07/01/2019 that showed EF 60-65%, no regional wall motion abnormalities, no evidence of mitral valve regurgitation or stenosis, no aortic valve regurgitation or stenosis.  Patient's most recent echocardiogram was completed 08/16/2023 and showed EF 60-65%, no regional wall motion abnormalities, normal RV systolic function, no significant valvular abnormalities.  Patient was last seen by cardiology on 08/14/2023.  At that time, patient was doing well.  Denied new cardiac complaints or concerns.  Remained on carvedilol , diltiazem , asprin, lipitor   PAF  - Has not been on AC due to CHADS-VASc 1  -  - Continue ASA 81 mg daily  - Continue carvedilol  12.5 mg BID, diltiazem  180 mg daily   HTN   HLD  - Lipid panel from 08/2023 showed Ldl 105  - Continue lipitor 20 mg daily   MVP  - Most recent echo from 08/2023 showed normal mitral valve structure, no MR or MS    ROS: ***  Studies Reviewed      *** Risk Assessment/Calculations {Does this patient have ATRIAL FIBRILLATION?:[phone]} No BP recorded.  {Refresh Note OR Click here to enter BP  :1}***       Physical Exam VS:  There were no vitals taken for this visit.       Wt Readings from Last 3 Encounters:  08/14/23 245 lb 12.8 oz (111.5 kg)  07/27/23 243 lb 6.2 oz (110.4 kg)  06/30/23 246 lb 9.6  oz (111.9 kg)    GEN: Well nourished, well developed in no acute distress NECK: No JVD; No carotid bruits CARDIAC: ***RRR, no murmurs, rubs, gallops RESPIRATORY:  Clear to auscultation without rales, wheezing or rhonchi  ABDOMEN: Soft, non-tender, non-distended EXTREMITIES:  No edema; No deformity   ASSESSMENT AND PLAN ***    {Are you ordering a CV Procedure (e.g. stress test, cath, DCCV, TEE, etc)?   Press F2        :[id number]}  Dispo: ***  Signed, Rollo FABIENE Louder, PA-C

## 2024-01-31 DIAGNOSIS — G4733 Obstructive sleep apnea (adult) (pediatric): Secondary | ICD-10-CM | POA: Diagnosis not present

## 2024-01-31 DIAGNOSIS — I482 Chronic atrial fibrillation, unspecified: Secondary | ICD-10-CM | POA: Diagnosis not present

## 2024-01-31 DIAGNOSIS — Z1331 Encounter for screening for depression: Secondary | ICD-10-CM | POA: Diagnosis not present

## 2024-01-31 DIAGNOSIS — I10 Essential (primary) hypertension: Secondary | ICD-10-CM | POA: Diagnosis not present

## 2024-02-01 ENCOUNTER — Ambulatory Visit: Admitting: Cardiology

## 2024-02-01 NOTE — Progress Notes (Deleted)
  Cardiology Office Note:  .   Date:  02/01/2024  ID:  Elodie Fischer, DOB 1971/04/16, MRN [id number] PCP: Lang Marna LABOR, MD  [facility] Providers Cardiologist:  Aleene Passe, MD (Inactive) {  History of Present Illness: .   Vontae Court is a 53 y.o. male with history of PAF, MVP, hypertension OSA on CPAP, hypertension, hyperlipidemia anxiety.     PAF Documented 2013, not on anticoagulation with a CHA2DS2-VASc of 1.   Heart monitor 11/2018 no evidence of atrial fibrillation.  History of MVP Most recent echocardiogram 08/2023 with preserved biventricular function.  No significant valvular disease.    Social history      Patient with remote history of PAF 2013, not on anticoagulation with low CHA2DS2-VASc of 1.  Last seen in office 08/2023 without any acute issues.  Recommended to follow-up as needed. Patient recently contacted the office complaining of shortness of breath, fatigue.  Heaviness in the chest.  Denied overt chest pain.  A week prior to that he was treated for walking pneumonia at a urgent care.  PAF Documented remotely 2013.  CHA2DS2-VASc 1.  Has not been on anticoagulation. Previous provider had prescribed aspirin  for stroke prevention, this is not indicated. Continue with Coreg  12.5 mg twice daily, diltiazem  100 mg daily.  OSA on CPAP Neurology is following this.  Hyperlipidemia LDL 08/2023 105. Continue with atorvastatin  20 mg daily  ROS: Denies: Chest pain, shortness of breath, orthopnea, peripheral edema, palpitations, decreased exercise intolerance, fatigue, lightheadedness.   Studies Reviewed: .         Risk Assessment/Calculations:   {Does this patient have ATRIAL FIBRILLATION?[phone]} No BP recorded.  {Refresh Note OR Click here to enter BP  :1}***       Physical Exam:   VS:  There were no vitals taken for this visit.   Wt Readings from Last 3 Encounters:  08/14/23 245 lb 12.8 oz (111.5 kg)  07/27/23 243 lb 6.2 oz (110.4 kg)   06/30/23 246 lb 9.6 oz (111.9 kg)    GEN: Well nourished, well developed in no acute distress NECK: No JVD; No carotid bruits CARDIAC: ***RRR, no murmurs, rubs, gallops RESPIRATORY:  Clear to auscultation without rales, wheezing or rhonchi  ABDOMEN: Soft, non-tender, non-distended EXTREMITIES:  No edema; No deformity   ASSESSMENT AND PLAN: .         {Are you ordering a CV Procedure (e.g. stress test, cath, DCCV, TEE, etc)?   Press F2        :[id number]}  Dispo: ***  Signed, Thom LITTIE Sluder, PA-C

## 2024-02-26 NOTE — Progress Notes (Deleted)
  Cardiology Office Note   Date:  02/26/2024  ID:  Jon Collins, DOB 1971-02-26, MRN [id number] PCP: Lang Marna LABOR, MD  [facility] Providers Cardiologist:  Aleene Passe, MD (Inactive)      History of Present Illness Jon Collins is a 53 y.o. male with a past medical history of hypertension, hyperlipidemia, anxiety, PE AF, MVP, OSA on CPAP.  Patient followed by Dr. Passe, presents today for evaluation of chest heaviness and shortness of breath.   Patient was initially seen in 2013 for evaluation of atrial fibrillation.  He is currently not on anticoagulation due to CHA2DS2-VASc score of 1.  Had an echocardiogram completed on on 07/01/2019 that showed EF 60-65%, no regional wall motion abnormalities, no evidence of mitral valve regurgitation or stenosis, no aortic valve regurgitation or stenosis.  Patient's most recent echocardiogram was completed 08/16/2023 and showed EF 60-65%, no regional wall motion abnormalities, normal RV systolic function, no significant valvular abnormalities.  Patient was last seen by cardiology on 08/14/2023.  At that time, patient was doing well.  Denied new cardiac complaints or concerns.  Remained on carvedilol , diltiazem , asprin, lipitor   PAF  - Has not been on AC due to CHADS-VASc 1  -  - Continue ASA 81 mg daily  - Continue carvedilol  12.5 mg BID, diltiazem  180 mg daily   HTN   HLD  - Lipid panel from 08/2023 showed Ldl 105  - Continue lipitor 20 mg daily   MVP  - Most recent echo from 08/2023 showed normal mitral valve structure, no MR or MS    ROS: ***  Studies Reviewed      *** Risk Assessment/Calculations {Does this patient have ATRIAL FIBRILLATION?:[phone]} No BP recorded.  {Refresh Note OR Click here to enter BP  :1}***       Physical Exam VS:  There were no vitals taken for this visit.       Wt Readings from Last 3 Encounters:  08/14/23 245 lb 12.8 oz (111.5 kg)  07/27/23 243 lb 6.2 oz (110.4 kg)  06/30/23  246 lb 9.6 oz (111.9 kg)    GEN: Well nourished, well developed in no acute distress NECK: No JVD; No carotid bruits CARDIAC: ***RRR, no murmurs, rubs, gallops RESPIRATORY:  Clear to auscultation without rales, wheezing or rhonchi  ABDOMEN: Soft, non-tender, non-distended EXTREMITIES:  No edema; No deformity   ASSESSMENT AND PLAN ***    {Are you ordering a CV Procedure (e.g. stress test, cath, DCCV, TEE, etc)?   Press F2        :[id number]}  Dispo: ***  Signed, Rollo FABIENE Louder, PA-C

## 2024-03-01 ENCOUNTER — Ambulatory Visit: Admitting: Orthopaedic Surgery

## 2024-03-04 ENCOUNTER — Ambulatory Visit: Admitting: Cardiology

## 2024-03-12 ENCOUNTER — Ambulatory Visit (INDEPENDENT_AMBULATORY_CARE_PROVIDER_SITE_OTHER): Admitting: Orthopaedic Surgery

## 2024-03-12 DIAGNOSIS — G5601 Carpal tunnel syndrome, right upper limb: Secondary | ICD-10-CM | POA: Diagnosis not present

## 2024-03-12 DIAGNOSIS — G5603 Carpal tunnel syndrome, bilateral upper limbs: Secondary | ICD-10-CM

## 2024-03-12 DIAGNOSIS — G5602 Carpal tunnel syndrome, left upper limb: Secondary | ICD-10-CM | POA: Diagnosis not present

## 2024-03-12 NOTE — Progress Notes (Signed)
 Office Visit Note   Patient: Jon Collins           Date of Birth: [birth year]           MRN: [id number] Visit Date: 03/12/2024              Requested by: Lang Marna LABOR, MD [address] [place],  [place] [address] PCP: Lang Marna LABOR, MD   Assessment & Plan: Visit Diagnoses:  1. Left carpal tunnel syndrome   2. Right carpal tunnel syndrome     Plan: History of Present Illness Jon Collins is a 53 year old male who presents with numbness and tingling in his hands.  He experiences numbness and tingling in his hands for the past few weeks, more pronounced in the left hand and predominantly at night. The sensation is similar to when a limb 'falls asleep'. 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 The symptoms are present in two fingers and are light during the day but more pronounced at night. No pain or dropping of objects is noted.  Physical Exam MUSCULOSKELETAL: Compression of left carpal tunnel reproduces numbness.  Normal thumb opposition.  Full composite fist.  Assessment and Plan Left carpal tunnel syndrome Numbness and tingling in the left hand, worse at night, consistent with carpal tunnel syndrome. No pain reported. - Advise wearing carpal tunnel braces to bed for a couple of weeks. - If symptoms persist, order nerve study.  Follow-Up Instructions: No follow-ups on file.    Subjective: Chief Complaint  Patient presents with   Right Hand - Pain, Numbness, Tingling   Left Hand - Pain, Numbness, Tingling    HPI  Review of Systems  Constitutional: Negative.   HENT: Negative.    Eyes: Negative.   Respiratory: Negative.    Cardiovascular: Negative.   Gastrointestinal: Negative.   Endocrine: Negative.   Genitourinary: Negative.   Skin: Negative.   Allergic/Immunologic: Negative.   Neurological: Negative.   Hematological: Negative.   Psychiatric/Behavioral: Negative.    All other  systems reviewed and are negative.    Objective: Vital Signs: There were no vitals taken for this visit.  Physical Exam Vitals and nursing note reviewed.  Constitutional:      Appearance: He is well-developed.  HENT:     Head: Normocephalic and atraumatic.  Eyes:     Pupils: Pupils are equal, round, and reactive to light.  Pulmonary:     Effort: Pulmonary effort is normal.  Abdominal:     Palpations: Abdomen is soft.  Musculoskeletal:        General: Normal range of motion.     Cervical back: Neck supple.  Skin:    General: Skin is warm.  Neurological:     Mental Status: He is alert and oriented to person, place, and time.  Psychiatric:        Behavior: Behavior normal.        Thought Content: Thought content normal.        Judgment: Judgment normal.     Ortho Exam  Specialty Comments:  CLINICAL DATA:  pain   EXAM: MRI CERVICAL SPINE WITHOUT CONTRAST   TECHNIQUE: Multiplanar, multisequence MR imaging of the cervical spine was performed. No intravenous contrast was administered.   COMPARISON:  None Available.   FINDINGS: Alignment: Straightening of the normal cervical lordosis   Vertebrae: No fracture, evidence of discitis, or bone lesion.   Cord: Normal signal and morphology.  Posterior Fossa, vertebral arteries, paraspinal tissues: Negative.   Disc levels:   C1-C2: Mild degenerative change.   C2-C3: Mild bilateral facet degenerative change. No significant disc bulge. No spinal canal narrowing. Mild left neural foraminal narrowing.   C3-C4: Mild bilateral facet degenerative change. Uncovertebral hypertrophy. Minimal disc bulge. No significant spinal canal narrowing. Moderate left neural foraminal narrowing.   C4-C5: Mild bilateral facet degenerative change. Circumferential disc bulge. Uncovertebral hypertrophy. Mild spinal canal narrowing. Moderate bilateral neural foraminal narrowing, left-greater-than-right.   C5-C6: Mild bilateral facet  degenerative change. Uncovertebral hypertrophy. Minimal disc bulge. Mild spinal canal narrowing. Severe left and moderate right neural foraminal narrowing.   C6-C7: Minimal disc bulge. Uncovertebral hypertrophy. Moderate left and mild right neural foraminal narrowing. No significant spinal canal narrowing.   C7-T1: No significant disc bulge. Mild bilateral facet degenerative change. No spinal canal or neural foraminal narrowing.   IMPRESSION: 1. Multilevel degenerative changes of the cervical spine with mild spinal canal narrowing at C4-C5 and C5-C6. 2. Multilevel neural foraminal narrowing, severe on the left at C5-C6 and moderate on the left at C3-C4, C4-C5, C5-C6, and C6-C7.     Electronically Signed   By: Lyndall Gore M.D.   On: 06/14/2023 08:41  Imaging: No results found.   PMFS History: Patient Active Problem List   Diagnosis Date Noted   Osteoarthritis of knees, bilateral 01/03/2024   Complete tear of left rotator cuff 07/27/2023   Impingement syndrome of left shoulder 07/27/2023   Arthritis of left acromioclavicular joint 07/27/2023   Reduced libido 11/09/2017   Decreased testosterone  level 11/07/2017   Mitral valve prolapse 01/14/2016   Hyperlipidemia 04/14/2015   Diverticulitis of large intestine without perforation or abscess with bleeding 09/30/2014   Palpitations suggestive of PVCs or PACs 10/16/2013   Sleep apnea 10/16/2013   A-fib (HCC) 05/15/2012   HTN (hypertension) 05/15/2012   Past Medical History:  Diagnosis Date   Anxiety    Arthritis    knees, left shoulder   Decreased testosterone  level 11/07/2017   Diverticulitis of large intestine without perforation or abscess with bleeding 09/30/2014   HTN (hypertension)    Hyperlipidemia 04/14/2015   Lone atrial fibrillation (HCC)    a. Dx 04/2012 in ER. Event monitor 07/2012 for palpitations - pt triggers correlated with NSR only.   MVP (mitral valve prolapse)    a. Pt reported dx at age 77, NOT seen  on echo. 2D echo 05/2012: EF 55-60%, no RWMA, grade 2 diastolic dysfunction, no significant valvular abnormalities.   PONV (postoperative nausea and vomiting)    Reduced libido 11/09/2017   Sleep apnea    uses CPAP nightly    Family History  Problem Relation Age of Onset   Hyperlipidemia Father    Stroke Maternal Grandmother    Heart attack Neg Hx     Past Surgical History:  Procedure Laterality Date   COLONOSCOPY     SHOULDER ARTHROSCOPY WITH DISTAL CLAVICLE RESECTION Left 07/27/2023   Procedure: DISTAL CLAVICLE EXCISION, EXTENSIVE DEBRIDEMENT;  Surgeon: Jerri Kay HERO, MD;  Location: [facility];  Service: Orthopedics;  Laterality: Left;   SHOULDER ARTHROSCOPY WITH ROTATOR CUFF REPAIR AND SUBACROMIAL DECOMPRESSION Left 07/27/2023   Procedure: LEFT SHOULDER ARTHROSCOPY WITH ROTATOR CUFF REPAIR, SUBACROMIAL DECOMPRESSION;  Surgeon: Jerri Kay HERO, MD;  Location: [facility];  Service: Orthopedics;  Laterality: Left;   UMBILICAL HERNIA REPAIR     Social History   Occupational History   Not on file  Tobacco Use  Smoking status: Never   Smokeless tobacco: Never  Vaping Use   Vaping status: Never Used  Substance and Sexual Activity   Alcohol use: Yes    Comment: social   Drug use: No   Sexual activity: Not on file

## 2024-03-22 DIAGNOSIS — N529 Male erectile dysfunction, unspecified: Secondary | ICD-10-CM | POA: Diagnosis not present

## 2024-03-26 NOTE — Progress Notes (Deleted)
  Cardiology Office Note   Date:  03/26/2024  ID:  Jon Collins, DOB 08/14/1970, MRN [id number] PCP: Lang Marna LABOR, MD  [facility] Providers Cardiologist:  Aleene Passe, MD (Inactive)      History of Present Illness Jon Collins is a 53 y.o. male with a past medical history of hypertension, hyperlipidemia, anxiety, PE AF, MVP, OSA on CPAP.  Patient followed by Dr. Passe, presents today for evaluation of chest heaviness and shortness of breath.   Patient was initially seen in 2013 for evaluation of atrial fibrillation.  He is currently not on anticoagulation due to CHA2DS2-VASc score of 1.  Had an echocardiogram completed on on 07/01/2019 that showed EF 60-65%, no regional wall motion abnormalities, no evidence of mitral valve regurgitation or stenosis, no aortic valve regurgitation or stenosis.  Patient's most recent echocardiogram was completed 08/16/2023 and showed EF 60-65%, no regional wall motion abnormalities, normal RV systolic function, no significant valvular abnormalities.  Patient was last seen by cardiology on 08/14/2023.  At that time, patient was doing well.  Denied new cardiac complaints or concerns.  Remained on carvedilol , diltiazem , asprin, lipitor   PAF  - Has not been on AC due to CHADS-VASc 1  -  - Continue ASA 81 mg daily  - Continue carvedilol  12.5 mg BID, diltiazem  180 mg daily   HTN   HLD  - Lipid panel from 08/2023 showed Ldl 105  - Continue lipitor 20 mg daily   MVP  - Most recent echo from 08/2023 showed normal mitral valve structure, no MR or MS    ROS: ***  Studies Reviewed      *** Risk Assessment/Calculations {Does this patient have ATRIAL FIBRILLATION?[phone]} No BP recorded.  {Refresh Note OR Click here to enter BP  :1}***       Physical Exam VS:  There were no vitals taken for this visit.       Wt Readings from Last 3 Encounters:  08/14/23 245 lb 12.8 oz (111.5 kg)  07/27/23 243 lb 6.2 oz (110.4 kg)  06/30/23  246 lb 9.6 oz (111.9 kg)    GEN: Well nourished, well developed in no acute distress NECK: No JVD; No carotid bruits CARDIAC: ***RRR, no murmurs, rubs, gallops RESPIRATORY:  Clear to auscultation without rales, wheezing or rhonchi  ABDOMEN: Soft, non-tender, non-distended EXTREMITIES:  No edema; No deformity   ASSESSMENT AND PLAN ***    {Are you ordering a CV Procedure (e.g. stress test, cath, DCCV, TEE, etc)?   Press F2        :[id number]}  Dispo: ***  Signed, Rollo FABIENE Louder, PA-C

## 2024-04-09 ENCOUNTER — Ambulatory Visit: Admitting: Cardiology

## 2024-04-22 NOTE — Progress Notes (Deleted)
 Cardiology Office Note:    Date:  04/22/2024   ID:  Jon Collins, DOB 09-19-1970, MRN [id number]  PCP:  Lang Marna LABOR, MD   [facility] Providers Cardiologist:  Aleene Passe, MD (Inactive) { Click to update primary MD,subspecialty MD or APP then REFRESH:1}    Referring MD: Lang Marna LABOR, MD   No chief complaint on file. ***  History of Present Illness:    Jon Collins is a 53 y.o. male with a hx of hypertension, hyperlipidemia, anxiety, PE AF, MVP, OSA on CPAP.  Patient followed by Dr. Passe, presents today for evaluation of chest heaviness and shortness of breath.   Patient was initially seen in 2013 for evaluation of atrial fibrillation.  He is currently not on anticoagulation due to CHA2DS2-VASc score of 1.  Had an echocardiogram completed on on 07/01/2019 that showed EF 60-65%, no regional wall motion abnormalities, no evidence of mitral valve regurgitation or stenosis, no aortic valve regurgitation or stenosis.  Patient's most recent echocardiogram was completed 08/16/2023 and showed EF 60-65%, no regional wall motion abnormalities, normal RV systolic function, no significant valvular abnormalities.  Patient was last seen by cardiology on 08/14/2023.  At that time, patient was doing well.  Denied new cardiac complaints or concerns.  Remained on carvedilol , diltiazem , asprin, lipitor.  He was added to my schedule for chest heaviness.   He presents today for    NEEDS NEW CARDIOLOGIST    PAF  - Has not been on AC due to CHADS-VASc 1  -  - Continue ASA 81 mg daily  - Continue carvedilol  12.5 mg BID, diltiazem  180 mg daily     HTN    HLD  - Lipid panel from 08/2023 showed Ldl 105  - Continue lipitor 20 mg daily    MVP  - Most recent echo from 08/2023 showed normal mitral valve structure, no MR or MS      Past Medical History:  Diagnosis Date   Anxiety    Arthritis    knees, left shoulder   Decreased testosterone  level  11/07/2017   Diverticulitis of large intestine without perforation or abscess with bleeding 09/30/2014   HTN (hypertension)    Hyperlipidemia 04/14/2015   Lone atrial fibrillation (HCC)    a. Dx 04/2012 in ER. Event monitor 07/2012 for palpitations - pt triggers correlated with NSR only.   MVP (mitral valve prolapse)    a. Pt reported dx at age 31, NOT seen on echo. 2D echo 05/2012: EF 55-60%, no RWMA, grade 2 diastolic dysfunction, no significant valvular abnormalities.   PONV (postoperative nausea and vomiting)    Reduced libido 11/09/2017   Sleep apnea    uses CPAP nightly    Past Surgical History:  Procedure Laterality Date   COLONOSCOPY     SHOULDER ARTHROSCOPY WITH DISTAL CLAVICLE RESECTION Left 07/27/2023   Procedure: DISTAL CLAVICLE EXCISION, EXTENSIVE DEBRIDEMENT;  Surgeon: Jerri Kay HERO, MD;  Location: [facility];  Service: Orthopedics;  Laterality: Left;   SHOULDER ARTHROSCOPY WITH ROTATOR CUFF REPAIR AND SUBACROMIAL DECOMPRESSION Left 07/27/2023   Procedure: LEFT SHOULDER ARTHROSCOPY WITH ROTATOR CUFF REPAIR, SUBACROMIAL DECOMPRESSION;  Surgeon: Jerri Kay HERO, MD;  Location:  SUR[facility]ERY C[facility];  Service: Orthopedics;  Laterality: Left;   UMBILICAL HERNIA REPAIR      Current Medications: No outpatient medications have been marked as taking for the 04/24/24 encounter (Appointment) with Madie Jon Garre, PA.     Allergies:   Amoxicillin   Social History  Socioeconomic History   Marital status: Single    Spouse name: Not on file   Number of children: Not on file   Years of education: Not on file   Highest education level: Not on file  Occupational History   Not on file  Tobacco Use   Smoking status: Never   Smokeless tobacco: Never  Vaping Use   Vaping status: Never Used  Substance and Sexual Activity   Alcohol use: Yes    Comment: social   Drug use: No   Sexual activity: Not on file  Other Topics Concern   Not on file  Social History  Narrative   Not on file   Social Drivers of Health   Financial Resource Strain: Low Risk  (01/31/2024)   Received from [facility]   Overall Financial Resource Strain (CARDIA)    How hard is it for you to pay for the very basics like food, housing, medical care, and heating?: Not hard at all  Food Insecurity: No Food Insecurity (01/31/2024)   [facility]   Hunger Vital Sign    Within the past 12 months, you worried that your food would run out before you got the money to buy more.: Never true    Within the past 12 months, the food you bought just didn't last and you didn't have money to get more.: Never true  Transportation Needs: No Transportation Needs (01/31/2024)   Received from [facility] - Transportation    In the past 12 months, has lack of transportation kept you from medical appointments or from getting medications?: No    In the past 12 months, has lack of transportation kept you from meetings, work, or from getting things needed for daily living?: No  Physical Activity: Unknown (01/31/2024)   Received [facility]   Exercise Vital Sign    On average, how many days per week do you engage in moderate to strenuous exercise (like a brisk walk)?: 5 days    On average, how many minutes do you engage in exercise at this level?: Patient declined  Stress: Stress Concern Present (01/31/2024)   Received from [facility] - Occupational Stress Questionnaire    Do you feel stress - tense, restless, nervous, or anxious, or unable to sleep at night because your mind is troubled all the time - these days?: To some extent  Social Connections: Socially Integrated (01/31/2024)   Received from [facility]   Social Network    How would you rate your social network (family, work, friends)?: Good participation with social networks     Family History: The patient's ***family history includes Hyperlipidemia in his  father; Stroke in his maternal grandmother. There is no history of Heart attack.  ROS:   Please see the history of present illness.    *** All other systems reviewed and are negative.  EKGs/Labs/Other Studies Reviewed:    The following studies were reviewed today: ***      Recent Labs: 08/14/2023: ALT 39  Recent Lipid Panel    Component Value Date/Time   CHOL 166 08/14/2023 0934   TRIG 96 08/14/2023 0934   HDL 43 08/14/2023 0934   CHOLHDL 3.9 08/14/2023 0934   CHOLHDL 4.3 12/01/2015 0856   VLDL 22 12/01/2015 0856   LDLCALC 105 (H) 08/14/2023 0934   LDLDIRECT 176.4 02/19/2013 1143     Risk Assessment/Calculations:   {Does this patient have ATRIAL FIBRILLATION?:[phone]}  No BP recorded.  {Refresh Note OR Click here to enter BP  :1}***         Physical Exam:    VS:  There were no vitals taken for this visit.    Wt Readings from Last 3 Encounters:  08/14/23 245 lb 12.8 oz (111.5 kg)  07/27/23 243 lb 6.2 oz (110.4 kg)  06/30/23 246 lb 9.6 oz (111.9 kg)     GEN: *** Well nourished, well developed in no acute distress HEENT: Normal NECK: No JVD; No carotid bruits LYMPHATICS: No lymphadenopathy CARDIAC: ***RRR, no murmurs, rubs, gallops RESPIRATORY:  Clear to auscultation without rales, wheezing or rhonchi  ABDOMEN: Soft, non-tender, non-distended MUSCULOSKELETAL:  No edema; No deformity  SKIN: Warm and dry NEUROLOGIC:  Alert and oriented x 3 PSYCHIATRIC:  Normal affect   ASSESSMENT:    No diagnosis found. PLAN:    In order of problems listed above:  ***      {Are you ordering a CV Procedure (e.g. stress test, cath, DCCV, TEE, etc)?   Press F2        :[id number]}    Medication Adjustments/Labs and Tests Ordered: Current medicines are reviewed at length with the patient today.  Concerns regarding medicines are outlined above.  No orders of the defined types were placed in this encounter.  No orders of the defined types were placed in this  encounter.   There are no Patient Instructions on file for this visit.   Signed, Jon Garre Malika Demario, PA  04/22/2024 4:12 PM    [facility]

## 2024-04-24 ENCOUNTER — Ambulatory Visit: Admitting: Physician Assistant

## 2024-05-13 ENCOUNTER — Encounter: Payer: Self-pay | Admitting: Radiology

## 2024-05-21 DIAGNOSIS — G4733 Obstructive sleep apnea (adult) (pediatric): Secondary | ICD-10-CM | POA: Diagnosis not present

## 2024-05-21 NOTE — Progress Notes (Unsigned)
 Cardiology Office Note:    Date:  05/22/2024   ID:  Elodie Fischer, DOB 08/02/1970, MRN [id number]  PCP:  Lang Marna LABOR, MD    [facility] Providers Cardiologist:  Georganna Archer, MD     Referring MD: Lang Marna LABOR, MD   Chief Complaint  Patient presents with   Follow-up    Annual follow up    History of Present Illness:    Jon Collins is a 53 y.o. male with a hx of hypertension, hyperlipidemia, anxiety, PE AF, OSA on CPAP.  Patient previously followed by Dr. Alveta.   Patient was initially seen in 2013 for evaluation of atrial fibrillation.  He is currently not on anticoagulation due to CHA2DS2-VASc score of 1.  Had an echocardiogram completed on on 07/01/2019 that showed EF 60-65%, no regional wall motion abnormalities, no evidence of mitral valve regurgitation or stenosis, no aortic valve regurgitation or stenosis.  Patient's most recent echocardiogram was completed 08/16/2023 and showed EF 60-65%, no regional wall motion abnormalities, normal RV systolic function, no significant valvular abnormalities. Remained on carvedilol , diltiazem , asprin, lipitor at last cardiology visit.  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 He is working on completing his IT degree, graduates in the spring. He sits with both jobs, but walks frequently as a security guard. I encouraged him to take the stairs between each floor.    Past Medical History:  Diagnosis Date   Anxiety    Arthritis    knees, left shoulder   Decreased testosterone  level 11/07/2017   Diverticulitis of large intestine without perforation or abscess with bleeding 09/30/2014   HTN (hypertension)    Hyperlipidemia 04/14/2015   Lone atrial fibrillation (HCC)    a. Dx 04/2012 in ER. Event monitor 07/2012 for palpitations - pt triggers correlated with NSR only.   MVP (mitral valve prolapse)    a. Pt reported dx at age 72, NOT seen on echo. 2D echo  05/2012: EF 55-60%, no RWMA, grade 2 diastolic dysfunction, no significant valvular abnormalities.   PONV (postoperative nausea and vomiting)    Reduced libido 11/09/2017   Sleep apnea    uses CPAP nightly    Past Surgical History:  Procedure Laterality Date   COLONOSCOPY     SHOULDER ARTHROSCOPY WITH DISTAL CLAVICLE RESECTION Left 07/27/2023   Procedure: DISTAL CLAVICLE EXCISION, EXTENSIVE DEBRIDEMENT;  Surgeon: Jerri Kay HERO, MD;  Location: [facility];  Service: Orthopedics;  Laterality: Left;   SHOULDER ARTHROSCOPY WITH ROTATOR CUFF REPAIR AND SUBACROMIAL DECOMPRESSION Left 07/27/2023   Procedure: LEFT SHOULDER ARTHROSCOPY WITH ROTATOR CUFF REPAIR, SUBACROMIAL DECOMPRESSION;  Surgeon: Jerri Kay HERO, MD;  Location: [facility];  Service: Orthopedics;  Laterality: Left;   UMBILICAL HERNIA REPAIR      Current Medications: Current Meds  Medication Sig   aspirin  EC 81 MG tablet Take 1 tablet (81 mg total) by mouth daily. Swallow whole.   atorvastatin  (LIPITOR) 40 MG tablet Take 1 tablet (40 mg total) by mouth at bedtime.   carvedilol  (COREG ) 12.5 MG tablet TAKE 1.5 TABLETS (18.75 MG TOTAL) BY MOUTH 2 (TWO) TIMES DAILY WITH A MEAL.   diltiazem  (TIAZAC ) 180 MG 24 hr capsule Take 1 capsule by mouth daily.   lisinopril-hydrochlorothiazide  (ZESTORETIC) 20-25 MG tablet Take 1 tablet by mouth daily.   sertraline (ZOLOFT) 50 MG tablet Take 50 mg by mouth daily.   tadalafil (CIALIS) 5 MG tablet Take 1  tablet by mouth as needed.   [DISCONTINUED] atorvastatin  (LIPITOR) 20 MG tablet TAKE ONE TABLET BY MOUTH ONCE DAILY     Allergies:   Amoxicillin   Social History   Socioeconomic History   Marital status: Single    Spouse name: Not on file   Number of children: Not on file   Years of education: Not on file   Highest education level: Not on file  Occupational History   Not on file  Tobacco Use   Smoking status: Never   Smokeless tobacco: Never  Vaping Use    Vaping status: Never Used  Substance and Sexual Activity   Alcohol use: Yes    Comment: social   Drug use: No   Sexual activity: Not on file  Other Topics Concern   Not on file  Social History Narrative   Not on file   Social Drivers of Health   Financial Resource Strain: Low Risk  (01/31/2024)   Received from [facility]   Overall Financial Resource Strain (CARDIA)    How hard is it for you to pay for the very basics like food, housing, medical care, and heating?: Not hard at all  Food Insecurity: No Food Insecurity (01/31/2024)   Received from [facility]   Hunger Vital Sign    Within the past 12 months, you worried that your food would run out before you got the money to buy more.: Never true    Within the past 12 months, the food you bought just didn't last and you didn't have money to get more.: Never true  Transportation Needs: No Transportation Needs (01/31/2024)   Received from [facility] - Transportation    In the past 12 months, has lack of transportation kept you from medical appointments or from getting medications?: No    In the past 12 months, has lack of transportation kept you from meetings, work, or from getting things needed for daily living?: No  Physical Activity: Unknown (01/31/2024)   Received from [facility]   Exercise Vital Sign    On average, how many days per week do you engage in moderate to strenuous exercise (like a brisk walk)?: 5 days    On average, how many minutes do you engage in exercise at this level?: Patient declined  Stress: Stress Concern Present (01/31/2024)   Received from [facility] - Occupational Stress Questionnaire    Do you feel stress - tense, restless, nervous, or anxious, or unable to sleep at night because your mind is troubled all the time - these days?: To some extent  Social Connections: Socially Integrated (01/31/2024)   Received from [facility]   Social Network     How would you rate your social network (family, work, friends)?: Good participation with social networks     Family History: The patient's family history includes Hyperlipidemia in his father; Stroke in his maternal grandmother. There is no history of Heart attack.  ROS:   Please see the history of present illness.     All other systems reviewed and are negative.  EKGs/Labs/Other Studies Reviewed:    The following studies were reviewed today:  EKG Interpretation Date/Time:  Wednesday May 22 2024 13:17:21 EST Ventricular Rate:  72 PR Interval:  136 QRS Duration:  98 QT Interval:  376 QTC Calculation: 411 R Axis:   41  Text Interpretation: Normal sinus rhythm Minimal voltage criteria for LVH, may be normal variant (  Sokolow-Lyon ) Early repolarization When compared with ECG of 30-Jun-2023 11:02, ST less elevated in Anterior leads Confirmed by Madie Slough ([id number]) on 05/22/2024 1:22:08 PM    Recent Labs: 08/14/2023: ALT 39  Recent Lipid Panel    Component Value Date/Time   CHOL 166 08/14/2023 0934   TRIG 96 08/14/2023 0934   HDL 43 08/14/2023 0934   CHOLHDL 3.9 08/14/2023 0934   CHOLHDL 4.3 12/01/2015 0856   VLDL 22 12/01/2015 0856   LDLCALC 105 (H) 08/14/2023 0934   LDLDIRECT 176.4 02/19/2013 1143     Risk Assessment/Calculations:    CHA2DS2-VASc Score = 1   This indicates a 0.6% annual risk of stroke. The patient's score is based upon: CHF History: 0 HTN History: 1 Diabetes History: 0 Stroke History: 0 Vascular Disease History: 0 Age Score: 0 Gender Score: 0                Physical Exam:    VS:  BP 136/80   Pulse 72   Ht 5' 7 (1.702 m)   Wt 235 lb (106.6 kg)   SpO2 96%   BMI 36.81 kg/m     Wt Readings from Last 3 Encounters:  05/22/24 235 lb (106.6 kg)  08/14/23 245 lb 12.8 oz (111.5 kg)  07/27/23 243 lb 6.2 oz (110.4 kg)     GEN:  Well nourished, well developed in no acute distress HEENT: Normal NECK: No JVD; No carotid  bruits LYMPHATICS: No lymphadenopathy CARDIAC: RRR, no murmurs, rubs, gallops RESPIRATORY:  Clear to auscultation without rales, wheezing or rhonchi  ABDOMEN: Soft, non-tender, non-distended MUSCULOSKELETAL:  No edema; No deformity  SKIN: Warm and dry NEUROLOGIC:  Alert and oriented x 3 PSYCHIATRIC:  Normal affect   ASSESSMENT:    1. Paroxysmal atrial fibrillation (HCC)   2. Primary hypertension   3. Hyperlipidemia with target LDL less than 70   4. MVP (mitral valve prolapse)    PLAN:    In order of problems listed above:  PAF  - Has not been on AC due to CHADS-VASc 1  - Continue ASA 81 mg daily  - Continue carvedilol  18.75 mg BID, diltiazem  180 mg daily    HTN  -Controlled in the setting of PAF rate controlling medications and lisinopril-hydrochlorothiazide  20-25 mg daily - labs 10/2023 - borderline, will watch for now, increase exercise   HLD with LDL goal < 70 - Lipid panel from 08/2023 showed LDL 105, was 112 in April 2025 - will increase lipitor from 20 mg --> to 40 mg lipitor  - he is a nonsmoker - will recheck labs with next PCP visit   MVP  - Most recent echo from 08/2023 showed normal mitral valve structure, no MR or MS   He is going to increase fiber and work on an exercise program. BP borderline, continue to watch, no med changes to anti-hypertensive regimen. Increase lipitor as above. Follow up in 1 year with Dr. Floretta.    Medication Adjustments/Labs and Tests Ordered: Current medicines are reviewed at length with the patient today.  Concerns regarding medicines are outlined above.  Orders Placed This Encounter  Procedures   EKG 12-Lead   Meds ordered this encounter  Medications   atorvastatin  (LIPITOR) 40 MG tablet    Sig: Take 1 tablet (40 mg total) by mouth at bedtime.    Dispense:  90 tablet    Refill:  3    Patient Instructions  Medication Instructions:  Increase Lipitor 40 mg take one tablet  daily at bedtime *If you need a refill on  your cardiac medications before your next appointment, please call your pharmacy*  Follow-Up: At [facility], you and your health needs are our priority.  As part of our continuing mission to provide you with exceptional heart care, our providers are all part of one team.  This team includes your primary Cardiologist (physician) and Advanced Practice Providers or APPs (Physician Assistants and Nurse Practitioners) who all work together to provide you with the care you need, when you need it.  Your next appointment:   1 year(s)  Provider:   Georganna Archer, MD              Signed, Jon Garre Dave Mannes, PA  05/22/2024 1:49 PM    [facility]

## 2024-05-22 ENCOUNTER — Encounter: Payer: Self-pay | Admitting: Physician Assistant

## 2024-05-22 ENCOUNTER — Ambulatory Visit: Attending: Physician Assistant | Admitting: Physician Assistant

## 2024-05-22 VITALS — BP 136/80 | HR 72 | Ht 67.0 in | Wt 235.0 lb

## 2024-05-22 DIAGNOSIS — I480 Paroxysmal atrial fibrillation: Secondary | ICD-10-CM

## 2024-05-22 DIAGNOSIS — I10 Essential (primary) hypertension: Secondary | ICD-10-CM

## 2024-05-22 DIAGNOSIS — E785 Hyperlipidemia, unspecified: Secondary | ICD-10-CM | POA: Diagnosis not present

## 2024-05-22 DIAGNOSIS — I341 Nonrheumatic mitral (valve) prolapse: Secondary | ICD-10-CM

## 2024-05-22 MED ORDER — ATORVASTATIN CALCIUM 40 MG PO TABS: 40.0000 mg | ORAL_TABLET | Freq: Every day | ORAL | 3 refills | Status: AC

## 2024-05-22 NOTE — Patient Instructions (Signed)
 Medication Instructions:  Increase Lipitor 40 mg take one tablet daily at bedtime *If you need a refill on your cardiac medications before your next appointment, please call your pharmacy*  Follow-Up: At [facility], you and your health needs are our priority.  As part of our continuing mission to provide you with exceptional heart care, our providers are all part of one team.  This team includes your primary Cardiologist (physician) and Advanced Practice Providers or APPs (Physician Assistants and Nurse Practitioners) who all work together to provide you with the care you need, when you need it.  Your next appointment:   1 year(s)  Provider:   Georganna Archer, MD

## 2024-08-06 ENCOUNTER — Ambulatory Visit: Admitting: Orthopaedic Surgery

## 2024-08-27 ENCOUNTER — Ambulatory Visit: Admitting: Orthopaedic Surgery

## 2026-06-05 ENCOUNTER — Ambulatory Visit: Admitting: Student
